# Patient Record
Sex: Male | Born: 1980 | Race: Black or African American | Hispanic: No | Marital: Married | Smoking: Current every day smoker
Health system: Southern US, Community
[De-identification: ages and names within clinical notes are randomized; demographics above are authoritative.]

## PROBLEM LIST (undated history)

## (undated) DIAGNOSIS — E785 Hyperlipidemia, unspecified: Secondary | ICD-10-CM

## (undated) HISTORY — DX: Hyperlipidemia, unspecified: E78.5

---

## 2004-01-29 ENCOUNTER — Emergency Department (HOSPITAL_COMMUNITY): Admission: EM | Admit: 2004-01-29 | Discharge: 2004-01-29 | Payer: Self-pay | Admitting: *Deleted

## 2004-02-13 ENCOUNTER — Ambulatory Visit: Payer: Self-pay | Admitting: Orthopedic Surgery

## 2004-05-03 ENCOUNTER — Emergency Department (HOSPITAL_COMMUNITY): Admission: EM | Admit: 2004-05-03 | Discharge: 2004-05-03 | Payer: Self-pay | Admitting: Emergency Medicine

## 2005-02-08 ENCOUNTER — Emergency Department (HOSPITAL_COMMUNITY): Admission: EM | Admit: 2005-02-08 | Discharge: 2005-02-08 | Payer: Self-pay | Admitting: Emergency Medicine

## 2014-02-23 ENCOUNTER — Encounter (HOSPITAL_COMMUNITY): Payer: Self-pay | Admitting: Cardiology

## 2014-02-23 ENCOUNTER — Emergency Department (HOSPITAL_COMMUNITY)
Admission: EM | Admit: 2014-02-23 | Discharge: 2014-02-23 | Disposition: A | Discharge status: Home / Self Care | Payer: Self-pay | Attending: Emergency Medicine | Admitting: Emergency Medicine

## 2014-02-23 DIAGNOSIS — L989 Disorder of the skin and subcutaneous tissue, unspecified: Secondary | ICD-10-CM | POA: Insufficient documentation

## 2014-02-23 DIAGNOSIS — Z720 Tobacco use: Secondary | ICD-10-CM | POA: Insufficient documentation

## 2014-02-23 LAB — GC/CHLAMYDIA PROBE AMP (~~LOC~~) NOT AT ARMC
CHLAMYDIA, DNA PROBE: NEGATIVE
Neisseria Gonorrhea: POSITIVE — AB

## 2014-02-23 MED ORDER — SULFAMETHOXAZOLE-TRIMETHOPRIM 800-160 MG PO TABS: 1.0000 | ORAL_TABLET | Freq: Two times a day (BID) | ORAL | Status: DC

## 2014-02-23 NOTE — ED Notes (Signed)
Lab at bedside

## 2014-02-23 NOTE — ED Provider Notes (Signed)
CSN: [id number]     Arrival date & time 02/23/14  4098 History   First MD Initiated Contact with Patient 02/23/14 [phone]     Chief Complaint  Patient presents with  . Abscess     (Consider location/radiation/quality/duration/timing/severity/associated sxs/prior Treatment) HPI   Christopher Meza is a 34 y.o. male who presents to the Emergency Department complaining of open lesion to his penis for 3 days.  He states the area began as what he thought was a "pimple" and he squeezed it, with only a slight amt of clear to yellow drainage.  He states it then became an open sore with pain to the touch.  He does report unprotected intercourse, but no new partners.  He was recently incarcerated.  He denies other lesions, fever, fatigue, abdominal pain, penile discharge or urinary changes.     History reviewed. No pertinent past medical history. History reviewed. No pertinent past surgical history. History reviewed. No pertinent family history. History  Substance Use Topics  . Smoking status: Current Every Day Smoker  . Smokeless tobacco: Not on file  . Alcohol Use: Yes     Comment: social    Review of Systems  Constitutional: Negative for fever, chills, activity change and appetite change.  Respiratory: Negative for chest tightness and shortness of breath.   Gastrointestinal: Negative for nausea, vomiting and abdominal pain.  Genitourinary: Negative for dysuria, hematuria, decreased urine volume, penile swelling, scrotal swelling and difficulty urinating.       Open sore to penis  Musculoskeletal: Negative for back pain.  Skin: Negative for rash.  Neurological: Negative for weakness and numbness.  Hematological: Negative for adenopathy.  Psychiatric/Behavioral: Negative for confusion.  All other systems reviewed and are negative.     Allergies  Review of patient's allergies indicates no known allergies.  Home Medications   Prior to Admission medications   Medication Sig Start Date  End Date Taking? Authorizing Provider  ibuprofen (ADVIL,MOTRIN) 200 MG tablet Take 400 mg by mouth every 6 (six) hours as needed for mild pain.   Yes Historical Provider, MD   BP 132/92 mmHg  Pulse 89  Temp(Src) 97.8 F (36.6 C) (Oral)  Resp 18  Ht  (1.778 m)  Wt 170 lb (77.111 kg)  BMI 24.39 kg/m2  SpO2 99% Physical Exam  Constitutional: He is oriented to person, place, and time. He appears well-developed and well-nourished. No distress.  Cardiovascular: Normal rate, regular rhythm, normal heart sounds and intact distal pulses.   No murmur heard. Pulmonary/Chest: Effort normal and breath sounds normal. No respiratory distress.  Abdominal: Soft. He exhibits no distension. There is no tenderness. There is no rebound.  Genitourinary:    Right testis shows no swelling and no tenderness. Left testis shows no swelling and no tenderness. Circumcised. No penile erythema. No discharge found.  Pea sized open lesion to the base of the penis.  No edema or surrounding erythema.  No urethral discharge.  Musculoskeletal: Normal range of motion. He exhibits no edema.  Lymphadenopathy:       Right: No inguinal adenopathy present.       Left: No inguinal adenopathy present.  Neurological: He is alert and oriented to person, place, and time. He exhibits normal muscle tone. Coordination normal.  Skin: Skin is warm and dry. No rash noted.  Psychiatric: He has a normal mood and affect.  Nursing note and vitals reviewed.   ED Course  Procedures (including critical care time) Labs Review Labs Reviewed  HERPES SIMPLEX VIRUS  CULTURE  CULTURE, ROUTINE-ABSCESS  RPR  HIV ANTIBODY (ROUTINE TESTING)  GC/CHLAMYDIA PROBE AMP ([place])    Imaging Review No results found.   EKG Interpretation None       GC, Chlamydia, HIV, RPR, HSV, and abscess cultures pending.  MDM   Final diagnoses:  Skin lesion    Pt with open lesion at the base of the penis.  No urethral discharge, Slight  purulent drainage present.  No surrounding erythema. Lesion appears mostly c/w folliculitis.  I will begin treatment with Bactrim and I have advised him that he will be contacted in 3-5 days with test results.  He agrees to refrain from sex until results are back.  Vitals stable, he is otherwise well appearing, agrees to plan and stable for d/c  [place]ce[place] Walthall L. Trisha Mangleriplett, PA-C 02/23/14 1011  Vanetta MuldersScott Zackowski, MD 02/23/14 1043

## 2014-02-23 NOTE — ED Notes (Signed)
Boil on penis times 3 days.

## 2014-02-24 LAB — RPR: RPR Ser Ql: NONREACTIVE

## 2014-02-26 ENCOUNTER — Telehealth (HOSPITAL_COMMUNITY): Payer: Self-pay

## 2014-02-26 LAB — HIV ANTIBODY (ROUTINE TESTING W REFLEX): HIV Screen 4th Generation wRfx: NONREACTIVE

## 2014-02-26 LAB — CULTURE, ROUTINE-ABSCESS
CULTURE: NO GROWTH
Gram Stain: NONE SEEN

## 2014-02-26 NOTE — ED Notes (Signed)
Positive for gonorrhea. Chart sent to EDP office for review.  

## 2014-02-27 ENCOUNTER — Telehealth (HOSPITAL_BASED_OUTPATIENT_CLINIC_OR_DEPARTMENT_OTHER): Payer: Self-pay | Admitting: Emergency Medicine

## 2014-02-28 ENCOUNTER — Encounter (HOSPITAL_COMMUNITY): Payer: Self-pay | Admitting: *Deleted

## 2014-02-28 ENCOUNTER — Emergency Department (HOSPITAL_COMMUNITY)
Admission: EM | Admit: 2014-02-28 | Discharge: 2014-02-28 | Disposition: A | Discharge status: Home / Self Care | Payer: Self-pay | Attending: Emergency Medicine | Admitting: Emergency Medicine

## 2014-02-28 DIAGNOSIS — Z720 Tobacco use: Secondary | ICD-10-CM | POA: Insufficient documentation

## 2014-02-28 DIAGNOSIS — A549 Gonococcal infection, unspecified: Secondary | ICD-10-CM | POA: Insufficient documentation

## 2014-02-28 DIAGNOSIS — Z792 Long term (current) use of antibiotics: Secondary | ICD-10-CM | POA: Insufficient documentation

## 2014-02-28 MED ORDER — CEFTRIAXONE SODIUM 250 MG IJ SOLR
250.0000 mg | Freq: Once | INTRAMUSCULAR | Status: AC
  Administered 2014-02-28: 250 mg via INTRAMUSCULAR
  Filled 2014-02-28: qty 250

## 2014-02-28 MED ORDER — LIDOCAINE HCL (PF) 1 % IJ SOLN
INTRAMUSCULAR | Status: AC
  Filled 2014-02-28: qty 5

## 2014-02-28 NOTE — Discharge Instructions (Signed)
You have been treated for gonorrhea with the rocephin injection.  Your partners should be advised as they will need treatment as well.  Do not have sex for the next week or until your symptoms are resolved.  You can get followup care at the Health Department or by your primary doctor as needed - see the resource guide above.   Gonorrhea Gonorrhea is an infection that can cause serious problems. If left untreated, the infection may:   Damage the male or male organs.   Cause women to be unable to have children (sterility).   Harm a fetus if the infected woman is pregnant.  It is important to get treatment for gonorrhea as soon as possible. It is also necessary that all your sexual partners be tested for the infection.  CAUSES  Gonorrhea is caused by bacteria called Neisseria gonorrhoeae. The infection is spread from person to person, usually by sexual contact (such as by anal, vaginal, or oral means). A newborn can contract the infection from his or her mother during birth.  SYMPTOMS  Some people with gonorrhea do not have symptoms. Symptoms may be different in females and males.  Females The most common symptoms are:   Pain in the lower abdomen.   Fever with or without chills.  Other symptoms include:   Abnormal vaginal discharge.   Painful intercourse.   Burning or itching of the vagina or lips of the vagina.   Abnormal vaginal bleeding.   Pain when urinating.   Long-lasting (chronic) pain in the lower abdomen, especially during menstruation or intercourse.   Inability to become pregnant.   Going into premature labor.   Irritation, pain, bleeding, or discharge from the rectum. This may occur if the infection was spread by anal sex.   Sore throat or swollen lymph nodes in the neck. This may occur if the infection was spread by oral sex.  Males The most common symptoms are:   Discharge from the penis.   Pain or burning during urination.   Pain or  swelling in the testicles. Other symptoms may include:   Irritation, pain, bleeding, or discharge from the rectum. This may occur if the infection was spread by anal sex.   Sore throat, fever, or swollen lymph nodes in the neck. This may occur if the infection was spread by oral sex.  DIAGNOSIS  A diagnosis is made after a physical exam is done and a sample of discharge is examined under a microscope for the presence of the bacteria. The discharge may be taken from the urethra, cervix, throat, or rectum.  TREATMENT  Gonorrhea is treated with antibiotic medicines. It is important for treatment to begin as soon as possible. Early treatment may prevent some problems from developing.  HOME CARE INSTRUCTIONS   Take medicines only as directed by your health care provider.   Take your antibiotic medicine as directed by your health care provider. Finish the antibiotic even if you start to feel better. Incomplete treatment will put you at risk for continued infection.   Do not have sex until treatment is complete or as directed by your health care provider.   Keep all follow-up visits as directed by your health care provider.   Not all test results are available during your visit. If your test results are not back during the visit, make an appointment with your health care provider to find out the results. Do not assume everything is normal if you have not heard from your health care  provider or the medical facility. It is your responsibility to get your test results.  If you test positive for gonorrhea, inform your recent sexual partners. They need to be checked for gonorrhea even if they do not have symptoms. They may need treatment, even if they test negative for gonorrhea.  SEEK MEDICAL CARE IF:   You develop any bad reaction to the medicine you were prescribed. This may include:   A rash.   Nausea.   Vomiting.   Diarrhea.   Your symptoms do not improve after a few days of  taking antibiotics.   Your symptoms get worse.   You develop increased pain, such as in the testicles (for males) or in the abdomen (for females).  You have a fever. MAKE SURE YOU:   Understand these instructions.  Will watch your condition.  Will get help right away if you are not doing well or get worse. Document Released: 12/27/1999 Document Revised: 05/15/2013 Document Reviewed: 07/06/2012 [facility] Patient Information 2015 [place]. This information is not intended to replace advice given to you by your health care provider. Make sure you discuss any questions you have with your health care provider.   Emergency Department Resource Guide 1) Find a Doctor and Pay Out of Pocket Although you won't have to find out who is covered by your insurance plan, it is a good idea to ask around and get recommendations. You will then need to call the office and see if the doctor you have chosen will accept you as a new patient and what types of options they offer for patients who are self-pay. Some doctors offer discounts or will set up payment plans for their patients who do not have insurance, but you will need to ask so you aren't surprised when you get to your appointment.  2) Contact Your Local Health Department Not all health departments have doctors that can see patients for sick visits, but many do, so it is worth a call to see if yours does. If you don't know where your local health department is, you can check in your phone book. The CDC also has a tool to help you locate your state's health department, and many state websites also have listings of all of their local health departments.  3) Find a Walk-in Clinic If your illness is not likely to be very severe or complicated, you may want to try a walk in clinic. These are popping up all over the country in pharmacies, drugstores, and shopping centers. They're usually staffed by nurse practitioners or physician assistants that have  been trained to treat common illnesses and complaints. They're usually fairly quick and inexpensive. However, if you have serious medical issues or chronic medical problems, these are probably not your best option.  No Primary Care Doctor: - Call Health Connect at  [phone] - they can help you locate a primary care doctor that  accepts your insurance, provides certain services, etc. - Physician Referral Service- [phone]  Chronic Pain Problems: Organization         Address  Phone   Notes  [facility]  [phone]) [phone] Patients need to be referred by their primary care doctor.   Medication Assistance: Organization         Address  Phone   Notes  [facility] Medication [facility] [address] [place], [place] [address] [phone] --Must be a resident of [place] -- Must have NO insurance coverage whatsoever (no  Medicaid/ Medicare, etc.) -- The pt. MUST have a primary care doctor that directs their care regularly and follows them in the community   [facility]ist  [phone]   [employer]  [phone]    Agencies that provide inexpensive medical care: Organization         Address  Phone   Notes  [place]  [phone]   [facility]    [phone]   [facility] [place], [place] [address] [phone]   [place] [address] [place] [phone]   Planned Parenthood    [phone]   [facility]    [phone]   [place] and [place] [address], [place] Phone:  [phone], Fax:  [phone] Hours of Operation:  9 am - 6 pm, M-F.  Also accepts Medicaid/Medicare and self-pay.  [facility] for [place] [address], [address], [place] Phone: [phone], Fax: [phone]. Hours of Operation:  8:30 am - 5:30 pm, M-F.  Also accepts Medicaid and  self-pay.  [facility] [place] [address], [place] Phone: [phone]   [place], [place], [place] [phone], Ext. [phone] Mondays & Thursdays: 7-9 AM.  First 15 patients are seen on a first come, first serve basis.    [place] Providers:  Organization         Address  Phone   Notes  [facility] [address],  [phone] Also accepts self-pay patients.  [facility] [address], [place]  [phone]   [place], [address], [place] [phone]   [facility] [address], [place] [phone]   Lucianne Lei [address], [place]   [phone] Only accepts [place] Access [place] patients after they have their name applied to their card.   Self-Pay (no insurance) in [facility]:  Organization         Address  Phone   Notes  Sickle Cell Patients, [facility] [place] [phone]   [facility] [place] [phone]   [facility] [place]  [place], [place], [place] [phone]   [facility]/Dr. Osei-Bonsu  [address], [place] or [address], [place] [phone] Phone number for both [place] and [place] locations is the same.  [facility] [address], [place] [phone]   [facility] [address], [place] or [address] [phone]   [facility] [address] [place] [phone], phone; [phone], fax Sees patients 1st and 3rd Saturday of every month.  Must not qualify for public or private insurance (i.e. Medicaid, Medicare, [place] Health Choice, Veterans' Benefits)  Household income  should be no more than 200% of the poverty level The clinic cannot treat you if you are pregnant or think you are pregnant  Sexually transmitted diseases are not treated at the clinic.    Dental Care: Organization         Address  Phone  Notes  [place]  [facility] [address] [place], [place] [phone] Accepts children up to age 35 who are enrolled in [place] or [place]; pregnant women with a Medicaid card; and children who have applied for Medicaid or [place] Health Choice, but were declined, whose parents can pay a reduced fee at time of service.  [facility] of [facility]  [address], [place] [phone] Accepts children up to age 67 who are enrolled in [place] or [place]; pregnant women with a Medicaid card; and children who have applied for Medicaid or  Health Choice, but were declined, whose parents can pay a reduced fee at time of service.  [place] [facility] Adult Dental Access PROGRAM  [place] [phone] Patients are seen by appointment only. Walk-ins are not accepted. [place] will see patients 76 years of age and older. Monday - Tuesday (8am-5pm) Most Wednesdays (8:30-5pm) $30 per visit, cash only  [facility] Adult Dental Access PROGRAM  [address], [facility] [phone] Patients are seen by appointment only. Walk-ins are not accepted. [place] will see patients 49 years of age and older. One Wednesday Evening (Monthly: Volunteer Based).  $30 per visit, cash only  [place]  [phone] for adults; Children under age 76, call Graduate Pediatric Dentistry at [phone]. Children aged 54-14, please call [phone] to request a pediatric application.  Dental services are provided in all areas of dental care including fillings, crowns and bridges, complete and partial dentures, implants, gum treatment,  root canals, and extractions. Preventive care is also provided. Treatment is provided to both adults and children. Patients are selected via a lottery and there is often a waiting list.   [facility] [address] [place]  [phone] [email or web address]   [facility] [address] [place], [place] [phone], Ext. [phone] Second and Fourth Thursday of each month, opens at 6:30 AM; Clinic ends at 9 AM.  Patients are seen on a first-come first-served basis, and a limited number are seen during each clinic.   [facility]  [address] Hillard Danker [place], [place] [phone]   Eligibility Requirements You must have lived in [place], [place], or [place] counties for at least the last three months.   You cannot be eligible for state or federal sponsored [employer], including [employer], [place], or [employer].   You generally cannot be eligible for healthcare insurance through your employer.    How to apply: Eligibility screenings are held every Tuesday and Wednesday afternoon from 1:00 pm until 4:00 pm. You do not need an appointment for the interview!  [facility] [address], [place], [place]   [place]  [place] Department  [place]  [phone]    Behavioral Health Resources in the Community: Intensive Outpatient Programs Organization         Address  Phone  Notes  [place]. [address], [place], [place] [phone]   [facility] [address], [place], [place]   ADS: Alcohol & Drug S[facility]cs [address], [place], [place]   [place] [address]  [place], [place] or [phone]   Substance Abuse Resources Organization         Address  Phone  Notes  Alcohol and Drug Services   [phone]   [facility]  [phone]   [facility] [place]  [phone]   Floydene Flock  [phone]   Residential & Outpatient Substance Abuse Program  [phone]   Psychological Services Organization         Address  Phone  Notes  [facility]al Health  [phone]   [facility] Services  [phone]   [facility] [address], [place] [phone] or [phone]    Mobile Crisis Teams Organization         Address  Phone  Notes  [facility], Mobile Crisis Care Unit  [phone]   [facility]  [address] [place], [place] [phone]   Doristine Locks [address] [place] [phone]    Self-Help/Support Groups Organization         Address  Phone             Notes  [facility]ental Health Assoc. of [place] - variety of support groups  336- I7437963 Call for more information  Narcotics Anonymous (NA), [facility] [address], [employer] [place]  2 meetings at this location   Statistician         Address  Phone  Notes  [facility] [address],    [place]  [phone]   [facility]  [address], [place] [address], [place], [place] [phone]   [facility] [address] [place], [place] [phone] Admissions: 8am-3pm M-F  [facility] [address]    [place], [place] [phone]   [facility] [address] [place], [place], [place] [phone]   [facility] [address]  [place], [place] [phone]   [facility] - Intensive Outpatient [address], [place], [place] [phone]   [facility] ([facility] [address] [place].,  [place], [place] [phone] or [phone]   [facility] ([facility]) [address] [address] [place], [place] [phone] Accepts Medicaid  [facility] [place] [address],  [place] [phone] Substance Abuse/Addiction Treatment   [facility] Organization         Address  Phone  Notes  [facility]  [phone]   Angie Fava, PhD [address] Ervin Knack [place], [place]   9194001483 or [phone]   [facility] Behavioral   [address] [place], [place] [phone]   [facility] [address], [place], [place] [phone] Insurance/Medicaid/sponsorship through [facility] and Families [address]                                    [place], [place] [phone] Therapy/tele-psych/case  [facility] [address], [place] [phone]    Dr. Lolly Mustache  [phone]   [facility] [place]  [employer] [facility] Dept. 1) [address] [place] 2) [address], [place] 3)  [address], [place] [phone]  [phone]   [facility] Child Abuse Hotline 307-374-9650 or [phone] (After Hours)

## 2014-02-28 NOTE — ED Notes (Signed)
Pt states he received a phone call stating he needed to come back to the ED or health dept to be treated for gonorrhea.

## 2014-02-28 NOTE — ED Provider Notes (Signed)
CSN: [id number]     Arrival date & time 02/28/14  0757 History   First MD Initiated Contact with Patient 02/28/14 0815     Chief Complaint  Patient presents with  . Follow-up     (Consider location/radiation/quality/duration/timing/severity/associated sxs/prior Treatment) The history is provided by the patient.   Christopher Meza is a 34 y.o. male presenting or treatment of gonorrhea.  He was seen here 5 days ago and treated with bactrim for a nodule on the base of his penis felt to be folliculitis which he states is improved, he continues taking his antibiotic.  His std testing resulted positive for gonorrhea.  He still denies any penile discharge or dysuria.  He has had unprotected sex but not since his last visit 5 days ago.  He denies fevers, chills, abdominal pain or rash.       History reviewed. No pertinent past medical history. History reviewed. No pertinent past surgical history. No family history on file. History  Substance Use Topics  . Smoking status: Current Every Day Smoker  . Smokeless tobacco: Not on file  . Alcohol Use: Yes     Comment: social    Review of Systems  Constitutional: Negative for fever.  HENT: Negative for congestion and sore throat.   Eyes: Negative.   Respiratory: Negative for chest tightness and shortness of breath.   Cardiovascular: Negative for chest pain.  Gastrointestinal: Negative for nausea and abdominal pain.  Genitourinary: Negative.  Negative for dysuria, discharge, penile swelling, scrotal swelling and testicular pain.  Musculoskeletal: Negative for joint swelling, arthralgias and neck pain.  Skin: Negative.  Negative for rash.  Neurological: Negative for dizziness, weakness, light-headedness, numbness and headaches.  Psychiatric/Behavioral: Negative.       Allergies  Review of patient's allergies indicates no known allergies.  Home Medications   Prior to Admission medications   Medication Sig Start Date End Date Taking?  Authorizing Provider  ibuprofen (ADVIL,MOTRIN) 200 MG tablet Take 400 mg by mouth every 6 (six) hours as needed for mild pain.    Historical Provider, MD  sulfamethoxazole-trimethoprim (SEPTRA DS) 800-160 MG per tablet Take 1 tablet by mouth 2 (two) times daily. For 10 days 02/23/14   Tammy L. Triplett, PA-C   BP 144/89 mmHg  Temp(Src) 97.7 F (36.5 C) (Oral)  Resp 16  Ht 5\' 10"  (1.778 m)  Wt 170 lb (77.111 kg)  BMI 24.39 kg/m2  SpO2 100% Physical Exam  Constitutional: He appears well-developed and well-nourished.  HENT:  Head: Normocephalic and atraumatic.  Eyes: Conjunctivae are normal.  Neck: Normal range of motion.  Cardiovascular: Normal rate.   Pulmonary/Chest: Effort normal.  Abdominal: Soft. Bowel sounds are normal. There is no tenderness.  Genitourinary:  Healing lesion dorsal penile base.  No drainage.  Musculoskeletal: Normal range of motion.  Neurological: He is alert.  Skin: Skin is warm and dry.  Psychiatric: He has a normal mood and affect.  Nursing note and vitals reviewed.   ED Course  Procedures (including critical care time) Labs Review Labs Reviewed - No data to display  Imaging Review No results found.   EKG Interpretation None      MDM   Final diagnoses:  Gonorrhea    Pt given rocephin injection.  Advised partners need tx.  Avoid sex for 1 week.  Referrals given for pcp.    Burgess AmorJulie Elianna Windom, PA-C 02/28/14 [phone]  Benny LennertJoseph L Zammit, MD 02/28/14 [phone]

## 2015-10-08 ENCOUNTER — Emergency Department (HOSPITAL_COMMUNITY): Payer: Self-pay

## 2015-10-08 ENCOUNTER — Emergency Department (HOSPITAL_COMMUNITY)
Admission: EM | Admit: 2015-10-08 | Discharge: 2015-10-08 | Disposition: A | Discharge status: Home / Self Care | Payer: Self-pay | Attending: Emergency Medicine | Admitting: Emergency Medicine

## 2015-10-08 ENCOUNTER — Encounter (HOSPITAL_COMMUNITY): Payer: Self-pay | Admitting: Emergency Medicine

## 2015-10-08 DIAGNOSIS — Z79899 Other long term (current) drug therapy: Secondary | ICD-10-CM | POA: Insufficient documentation

## 2015-10-08 DIAGNOSIS — X501XXA Overexertion from prolonged static or awkward postures, initial encounter: Secondary | ICD-10-CM | POA: Insufficient documentation

## 2015-10-08 DIAGNOSIS — Y999 Unspecified external cause status: Secondary | ICD-10-CM | POA: Insufficient documentation

## 2015-10-08 DIAGNOSIS — Z791 Long term (current) use of non-steroidal anti-inflammatories (NSAID): Secondary | ICD-10-CM | POA: Insufficient documentation

## 2015-10-08 DIAGNOSIS — Y929 Unspecified place or not applicable: Secondary | ICD-10-CM | POA: Insufficient documentation

## 2015-10-08 DIAGNOSIS — S8392XA Sprain of unspecified site of left knee, initial encounter: Secondary | ICD-10-CM | POA: Insufficient documentation

## 2015-10-08 DIAGNOSIS — F17210 Nicotine dependence, cigarettes, uncomplicated: Secondary | ICD-10-CM | POA: Insufficient documentation

## 2015-10-08 DIAGNOSIS — Y9301 Activity, walking, marching and hiking: Secondary | ICD-10-CM | POA: Insufficient documentation

## 2015-10-08 MED ORDER — NAPROXEN 500 MG PO TABS: 500.0000 mg | ORAL_TABLET | Freq: Two times a day (BID) | ORAL | 0 refills | Status: DC

## 2015-10-08 NOTE — ED Triage Notes (Signed)
Pt reports left knee pain x1 month, denies injury. States he just "woke up" one day and had pain. Pt ambulatory to room.

## 2015-10-08 NOTE — ED Provider Notes (Signed)
[facility]-EMERGENCY DEPT Provider Note   CSN: [id number] Arrival date & time: 10/08/15  4098     History   Chief Complaint Chief Complaint  Patient presents with  . Knee Pain    HPI Christopher Meza is a 35 y.o. male.  HPI  Christopher Meza is a 35 y.o. male who presents to the Emergency Department complaining of left knee pain for one month.  He describes an aching and tightness to the knee with bending and standing.  He states the pain has been mild, but worsening for one week.  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  The knee pain is associated with swelling.  He denies recent injury, calf pain or swelling, fever, or redness of the joint.     History reviewed. No pertinent past medical history.  There are no active problems to display for this patient.   History reviewed. No pertinent surgical history.     Home Medications    Prior to Admission medications   Medication Sig Start Date End Date Taking? Authorizing Provider  ibuprofen (ADVIL,MOTRIN) 200 MG tablet Take 400 mg by mouth every 6 (six) hours as needed for mild pain.    Historical Provider, MD  sulfamethoxazole-trimethoprim (SEPTRA DS) 800-160 MG per tablet Take 1 tablet by mouth 2 (two) times daily. For 10 days 02/23/14   Pauline Aus, PA-C    Family History History reviewed. No pertinent family history.  Social History Social History  Substance Use Topics  . Smoking status: Current Every Day Smoker    Packs/day: 0.50    Types: Cigarettes  . Smokeless tobacco: Never Used  . Alcohol use Yes     Comment: social     Allergies   Review of patient's allergies indicates no known allergies.   Review of Systems Review of Systems  Constitutional: Negative for chills and fever.  Genitourinary: Negative for difficulty urinating and dysuria.  Musculoskeletal: Positive for arthralgias (left knee pain) and joint swelling.  Skin: Negative for color change and  wound.  Neurological: Negative for weakness and numbness.  All other systems reviewed and are negative.    Physical Exam Updated Vital Signs BP 130/82 (BP Location: Left Arm)   Pulse 95   Temp 97.4 F (36.3 C) (Oral)   Resp 16   Ht 5\' 10"  (1.778 m)   Wt 79.4 kg   SpO2 98%   BMI 25.11 kg/m   Physical Exam  Constitutional: He is oriented to person, place, and time. He appears well-developed and well-nourished. No distress.  Cardiovascular: Normal rate, regular rhythm and intact distal pulses.   Pulmonary/Chest: Effort normal and breath sounds normal.  Musculoskeletal: He exhibits tenderness. He exhibits no edema.  ttp of the anterior left knee.  No erythema, effusion, excessive warmth or step-off deformity.  DP pulse brisk, distal sensation intact. Calf is soft and NT.  Neurological: He is alert and oriented to person, place, and time. He exhibits normal muscle tone. Coordination normal.  Skin: Skin is warm and dry. No erythema.  Nursing note and vitals reviewed.    ED Treatments / Results  Labs (all labs ordered are listed, but only abnormal results are displayed) Labs Reviewed - No data to display  EKG  EKG Interpretation None       Radiology Dg Knee Complete 4 Views Left  Result Date: 10/08/2015 CLINICAL DATA:  Pain for 1 month EXAM: LEFT KNEE - COMPLETE 4+ VIEW COMPARISON:  None. FINDINGS: Weightbearing frontal, weight-bearing tunnel, lateral, and sunrise patellar images were obtained. There is soft tissue swelling medially. There is no fracture or dislocation. No joint effusion. The joint spaces appear normal. No erosive change. IMPRESSION: Soft tissue swelling medially. No fracture or joint effusion. No evident arthropathy. Electronically Signed   By: Bretta BangWilliam  Woodruff III M.D.   On: 10/08/2015 09:35    Procedures Procedures (including critical care time)  Medications Ordered in ED Medications - No data to display   Initial Impression / Assessment and Plan  / ED Course  I have reviewed the triage vital signs and the nursing notes.  Pertinent labs & imaging results that were available during my care of the patient were reviewed by me and considered in my medical decision making (see chart for details).  Clinical Course    Pt with diffuse tenderness of the left knee that's chronic.  No concerning sx for septic joint.  Ambulates with steady gait.   ACE wrap applied.  Agrees to RICE therapy and NSAID.  Referral to orthopedics.   Final Clinical Impressions(s) / ED Diagnoses   Final diagnoses:  Left knee sprain, initial encounter    New Prescriptions New Prescriptions   No medications on file     Pauline Ausammy Ashantae Pangallo, PA-C 10/08/15 1021    Blane OharaJoshua Zavitz, MD 10/08/15 1540

## 2015-10-08 NOTE — Discharge Instructions (Signed)
Wear the ace wrap as needed, but not continuously.  Apply ice packs on/off.  Do not wear to bed.  Call Dr. Mort SawyersHarrison's office to arrange a follow-up appt.

## 2015-12-11 ENCOUNTER — Ambulatory Visit: Payer: Self-pay | Admitting: Physician Assistant

## 2015-12-11 ENCOUNTER — Encounter: Payer: Self-pay | Admitting: Physician Assistant

## 2015-12-11 VITALS — BP 110/70 | HR 92 | Temp 97.9°F | Ht 69.5 in | Wt 168.5 lb

## 2015-12-11 DIAGNOSIS — F17210 Nicotine dependence, cigarettes, uncomplicated: Secondary | ICD-10-CM | POA: Insufficient documentation

## 2015-12-11 DIAGNOSIS — M25562 Pain in left knee: Principal | ICD-10-CM

## 2015-12-11 DIAGNOSIS — K0889 Other specified disorders of teeth and supporting structures: Secondary | ICD-10-CM | POA: Insufficient documentation

## 2015-12-11 DIAGNOSIS — Z8639 Personal history of other endocrine, nutritional and metabolic disease: Secondary | ICD-10-CM

## 2015-12-11 DIAGNOSIS — G8929 Other chronic pain: Secondary | ICD-10-CM | POA: Insufficient documentation

## 2015-12-11 DIAGNOSIS — Z131 Encounter for screening for diabetes mellitus: Secondary | ICD-10-CM

## 2015-12-11 LAB — GLUCOSE, POCT (MANUAL RESULT ENTRY): POC GLUCOSE: 88 mg/dL (ref 70–99)

## 2015-12-11 MED ORDER — DICLOFENAC SODIUM 75 MG PO TBEC: 75.0000 mg | DELAYED_RELEASE_TABLET | Freq: Two times a day (BID) | ORAL | 0 refills | Status: DC

## 2015-12-11 NOTE — Patient Instructions (Signed)

## 2015-12-11 NOTE — Progress Notes (Signed)
BP 110/70 (BP Location: Left Arm, Patient Position: Sitting, Cuff Size: Normal)   Pulse 92   Temp 97.9 F (36.6 C) (Other (Comment))   Ht 5' 9.5" (1.765 m)   Wt 168 lb 8 oz (76.4 kg)   SpO2 96%   BMI 24.53 kg/m    Subjective:    Patient ID: Christopher Meza, male    DOB: 08/13/1980, [age] y.o.   MRN: [id number]  HPI: Christopher Meza is a 35 y.o. male presenting on 12/11/2015 for New Patient (Initial Visit)   HPI   Pt's Main concern is L knee pain.   Hurting for 2-3 months.  Unaware of injury.  Says "it just started hurting".  It's still a little bit swollen he says.  He went to ER for this in September and had normal xray at that time   He also c/o tooth pain at times. Has a hole in his tooth. Tooth is also cracked.  Hurting for about a month. Hole in there for about  2 months.  Pt says he is Not working now  [facility] seen there in October. Says he goes there for STDs  Relevant past medical, surgical, family and social history reviewed and updated as indicated. Interim medical history since our last visit reviewed. Allergies and medications reviewed and updated.   Current Outpatient Prescriptions:  .  guaifenesin (ROBITUSSIN) 100 MG/5ML syrup, Take 200 mg by mouth 2 (two) times daily as needed for cough., Disp: , Rfl:    Review of Systems  Constitutional: Positive for appetite change and diaphoresis. Negative for chills, fatigue, fever and unexpected weight change.  HENT: Positive for congestion and dental problem. Negative for drooling, ear pain, facial swelling, hearing loss, mouth sores, sneezing, sore throat, trouble swallowing and voice change.   Eyes: Negative for pain, discharge, redness, itching and visual disturbance.  Respiratory: Positive for cough, shortness of breath and wheezing. Negative for choking.   Cardiovascular: Negative for chest pain, palpitations and leg swelling.  Gastrointestinal: Negative for abdominal pain, blood in stool,  constipation, diarrhea and vomiting.  Endocrine: Positive for polydipsia. Negative for cold intolerance and heat intolerance.  Genitourinary: Negative for decreased urine volume, dysuria and hematuria.  Musculoskeletal: Positive for arthralgias. Negative for back pain and gait problem.  Skin: Negative for rash.  Allergic/Immunologic: Negative for environmental allergies.  Neurological: Negative for seizures, syncope, light-headedness and headaches.  Hematological: Negative for adenopathy.  Psychiatric/Behavioral: Positive for agitation. Negative for dysphoric mood and suicidal ideas. The patient is not nervous/anxious.     Per HPI unless specifically indicated above     Objective:    BP 110/70 (BP Location: Left Arm, Patient Position: Sitting, Cuff Size: Normal)   Pulse 92   Temp 97.9 F (36.6 C) (Other (Comment))   Ht 5' 9.5" (1.765 m)   Wt 168 lb 8 oz (76.4 kg)   SpO2 96%   BMI 24.53 kg/m   Wt Readings from Last 3 Encounters:  12/11/15 168 lb 8 oz (76.4 kg)  10/08/15 175 lb (79.4 kg)  02/28/14 170 lb (77.1 kg)    Physical Exam  Constitutional: He is oriented to person, place, and time. He appears well-developed and well-nourished.  HENT:  Head: Normocephalic and atraumatic.  Mouth/Throat: Uvula is midline and oropharynx is clear and moist. Abnormal dentition. No dental abscesses. No oropharyngeal exudate.  Small hole in L upper molar tooth.  No abscess  Eyes: Conjunctivae and EOM are normal. Pupils are  equal, round, and reactive to light.  Neck: Neck supple. No thyromegaly present.  Cardiovascular: Normal rate and regular rhythm.   Pulmonary/Chest: Effort normal and breath sounds normal. He has no wheezes. He has no rales.  Abdominal: Soft. Bowel sounds are normal. He exhibits no mass. There is no hepatosplenomegaly. There is no tenderness.  Musculoskeletal: He exhibits no edema.       Left knee: He exhibits swelling (very mild). He exhibits normal range of motion, no  effusion, no ecchymosis, no deformity, no erythema, normal alignment, no LCL laxity, no bony tenderness and no MCL laxity.  Mild tenderness medially L knee  Lymphadenopathy:    He has no cervical adenopathy.  Neurological: He is alert and oriented to person, place, and time.  Skin: Skin is warm and dry. No rash noted.  Psychiatric: He has a normal mood and affect. His behavior is normal. Thought content normal.  Vitals reviewed.      Assessment & Plan:     Encounter Diagnoses  Name Primary?  . Chronic pain of left knee Yes  . Toothache   . Cigarette nicotine dependence without complication   . History of hyperlipidemia   . Screening for diabetes mellitus      -get baseline labs -pt put on Dental list -rx Diclofenac, ice the knee 2 or 3 times daily and wear elastic knee sleeve while up and ambulating -counseled pt to use condoms to reduce risks of STDs -follow up in one month to review labs and reevaluate knee Pt wants to be seen here

## 2015-12-12 LAB — COMPREHENSIVE METABOLIC PANEL
ALT: 14 U/L (ref 9–46)
AST: 14 U/L (ref 10–40)
Albumin: 4 g/dL (ref 3.6–5.1)
Alkaline Phosphatase: 57 U/L (ref 40–115)
BILIRUBIN TOTAL: 0.4 mg/dL (ref 0.2–1.2)
BUN: 14 mg/dL (ref 7–25)
CALCIUM: 9.5 mg/dL (ref 8.6–10.3)
CO2: 27 mmol/L (ref 20–31)
Chloride: 106 mmol/L (ref 98–110)
Creat: 0.88 mg/dL (ref 0.60–1.35)
GLUCOSE: 96 mg/dL (ref 65–99)
Potassium: 4.6 mmol/L (ref 3.5–5.3)
Sodium: 140 mmol/L (ref 135–146)
TOTAL PROTEIN: 6.8 g/dL (ref 6.1–8.1)

## 2015-12-12 LAB — LIPID PANEL
CHOLESTEROL: 199 mg/dL (ref <200)
HDL: 40 mg/dL — ABNORMAL LOW (ref >40)
LDL CALC: 145 mg/dL — AB (ref <100)
Total CHOL/HDL Ratio: 5 Ratio — ABNORMAL HIGH (ref <5.0)
Triglycerides: 71 mg/dL (ref <150)
VLDL: 14 mg/dL (ref <30)

## 2015-12-13 LAB — HEMOGLOBIN A1C
Hgb A1c MFr Bld: 4.5 % (ref <5.7)
MEAN PLASMA GLUCOSE: 82 mg/dL

## 2016-01-16 ENCOUNTER — Ambulatory Visit: Payer: Self-pay | Admitting: Physician Assistant

## 2016-01-20 ENCOUNTER — Encounter: Payer: Self-pay | Admitting: Physician Assistant

## 2016-02-06 ENCOUNTER — Ambulatory Visit: Payer: Self-pay | Admitting: Physician Assistant

## 2016-02-13 ENCOUNTER — Encounter: Payer: Self-pay | Admitting: Physician Assistant

## 2016-03-31 ENCOUNTER — Ambulatory Visit: Payer: Self-pay | Admitting: Physician Assistant

## 2016-04-01 ENCOUNTER — Encounter: Payer: Self-pay | Admitting: Student

## 2017-01-20 ENCOUNTER — Encounter (HOSPITAL_COMMUNITY): Payer: Self-pay | Admitting: Emergency Medicine

## 2017-01-20 DIAGNOSIS — E785 Hyperlipidemia, unspecified: Secondary | ICD-10-CM | POA: Insufficient documentation

## 2017-01-20 DIAGNOSIS — F17210 Nicotine dependence, cigarettes, uncomplicated: Secondary | ICD-10-CM | POA: Insufficient documentation

## 2017-01-20 DIAGNOSIS — K029 Dental caries, unspecified: Secondary | ICD-10-CM | POA: Insufficient documentation

## 2017-01-20 MED ORDER — IBUPROFEN 400 MG PO TABS
400.0000 mg | ORAL_TABLET | Freq: Once | ORAL | Status: AC | PRN
  Administered 2017-01-20: 400 mg via ORAL
  Filled 2017-01-20: qty 1

## 2017-01-20 NOTE — ED Triage Notes (Signed)
Pt presents with L upper dental pain ongoing for several weeks and putting off getting treatment from dentist and can no longer take the pain

## 2017-01-21 ENCOUNTER — Emergency Department (HOSPITAL_COMMUNITY)
Admission: EM | Admit: 2017-01-21 | Discharge: 2017-01-21 | Disposition: A | Discharge status: Home / Self Care | Payer: Self-pay | Attending: Emergency Medicine | Admitting: Emergency Medicine

## 2017-01-21 DIAGNOSIS — K029 Dental caries, unspecified: Secondary | ICD-10-CM

## 2017-01-21 MED ORDER — PENICILLIN V POTASSIUM 500 MG PO TABS: 500.0000 mg | ORAL_TABLET | Freq: Four times a day (QID) | ORAL | 0 refills | Status: AC

## 2017-01-21 MED ORDER — IBUPROFEN 800 MG PO TABS
ORAL_TABLET | ORAL | Status: AC
  Administered 2017-01-21: 03:00:00
  Filled 2017-01-21: qty 1

## 2017-01-21 MED ORDER — PENICILLIN V POTASSIUM 250 MG PO TABS
ORAL_TABLET | ORAL | Status: AC
  Administered 2017-01-21: 03:00:00
  Filled 2017-01-21: qty 2

## 2017-01-21 MED ORDER — NAPROXEN 375 MG PO TABS: 375.0000 mg | ORAL_TABLET | Freq: Two times a day (BID) | ORAL | 0 refills | Status: DC

## 2017-01-21 NOTE — ED Provider Notes (Signed)
[facility] EMERGENCY DEPARTMENT Provider Note   CSN: [id number] Arrival date & time: 01/20/17  2148     History   Chief Complaint Chief Complaint  Patient presents with  . Dental Pain  . Oral Swelling    HPI Christopher Meza is a 37 y.o. male.  The history is provided by the patient.  Dental Pain   This is a recurrent problem. The current episode started more than 1 week ago. The problem occurs constantly. The problem has not changed since onset.The pain is at a severity of 10/10. The pain is severe. He has tried nothing for the symptoms. The treatment provided no relief.  Known caries that haven't been Xrayed or fixed yet  Past Medical History:  Diagnosis Date  . Hyperlipidemia     Patient Active Problem List   Diagnosis Date Noted  . Cigarette nicotine dependence without complication 12/11/2015  . Chronic pain of left knee 12/11/2015  . Toothache 12/11/2015    History reviewed. No pertinent surgical history.     Home Medications    Prior to Admission medications   Medication Sig Start Date End Date Taking? Authorizing Provider  diclofenac (VOLTAREN) 75 MG EC tablet Take 1 tablet (75 mg total) by mouth 2 (two) times daily. 12/11/15   Jacquelin Hawking, PA-C  guaifenesin (ROBITUSSIN) 100 MG/5ML syrup Take 200 mg by mouth 2 (two) times daily as needed for cough.    [provider]  naproxen (NAPROSYN) 375 MG tablet Take 1 tablet (375 mg total) by mouth 2 (two) times daily. 01/21/17   Dua Mehler, MD  penicillin v potassium (VEETID) 500 MG tablet Take 1 tablet (500 mg total) by mouth 4 (four) times daily for 7 days. 01/21/17 01/28/17  Raya Mckinstry, MD    Family History Family History  Problem Relation Age of Onset  . Diabetes Mother   . Diabetes Maternal Grandmother     Social History Social History   Tobacco Use  . Smoking status: Current Every Day Smoker    Packs/day: 1.00    Years: 20.00    Pack years: 20.00    Types:  Cigarettes  . Smokeless tobacco: Never Used  Substance Use Topics  . Alcohol use: Yes    Comment: social  . Drug use: Yes    Types: Marijuana     Allergies   Patient has no known allergies.   Review of Systems Review of Systems  Constitutional: Negative for fever and unexpected weight change.  HENT: Positive for dental problem. Negative for congestion, ear pain and facial swelling.   Respiratory: Negative for shortness of breath.   Cardiovascular: Negative for chest pain.  Genitourinary: Negative for hematuria.  All other systems reviewed and are negative.    Physical Exam Updated Vital Signs BP 121/75 (BP Location: Right Arm)   Pulse 84   Temp 98.3 F (36.8 C) (Oral)   Resp 16   Ht 5\' 10"  (1.778 m)   Wt 79.4 kg (175 lb)   SpO2 99%   BMI 25.11 kg/m   Physical Exam  Constitutional: He is oriented to person, place, and time. He appears well-developed. No distress.  HENT:  Head: Normocephalic and atraumatic.  Nose: Nose normal.  Mouth/Throat: No trismus in the jaw. Dental caries present. No dental abscesses or uvula swelling. No oropharyngeal exudate.  Eyes: Conjunctivae are normal. Pupils are equal, round, and reactive to light.  Neck: Normal range of motion. Neck supple.  Cardiovascular: Normal rate, regular rhythm, normal heart  sounds and intact distal pulses.  Pulmonary/Chest: Effort normal and breath sounds normal. No stridor. He has no wheezes. He has no rales.  Abdominal: Soft. Bowel sounds are normal. He exhibits no mass. There is no tenderness. There is no rebound and no guarding.  Musculoskeletal: Normal range of motion.  Lymphadenopathy:    He has no cervical adenopathy.  Neurological: He is alert and oriented to person, place, and time. He displays normal reflexes.  Skin: Skin is warm and dry. Capillary refill takes less than 2 seconds.     ED Treatments / Results   Vitals:   01/20/17 2308  BP: 121/75  Pulse: 84  Resp: 16  Temp: 98.3 F (36.8  C)  SpO2: 99%   Procedures Procedures (including critical care time)  Medications Ordered in ED Medications  ibuprofen (ADVIL,MOTRIN) 800 MG tablet (not administered)  penicillin v potassium (VEETID) 250 MG tablet (not administered)  ibuprofen (ADVIL,MOTRIN) tablet 400 mg (400 mg Oral Given 01/20/17 2319)       Final Clinical Impressions(s) / ED Diagnoses   Final diagnoses:  Pain due to dental caries  Return for worsening pain, fevers > 100.4 unrelieved by medication, shortness of breath, intractable vomiting, or diarrhea, abdominal pain, Inability to tolerate liquids or food, cough, altered mental status or any concerns. No signs of systemic illness or infection. The patient is nontoxic-appearing on exam and vital signs are within normal limits.    I have reviewed the triage vital signs and the nursing notes. Pertinent labs &imaging results that were available during my care of the patient were reviewed by me and considered in my medical decision making (see chart for details).  After history, exam, and medical workup I feel the patient has been appropriately medically screened and is safe for discharge home. Pertinent diagnoses were discussed with the patient. Patient was given return precautions.      ED Discharge Orders        Ordered    penicillin v potassium (VEETID) 500 MG tablet  4 times daily     01/21/17 0209    naproxen (NAPROSYN) 375 MG tablet  2 times daily     01/21/17 0209       Vicki Chaffin, MD 01/21/17 0211

## 2017-10-21 ENCOUNTER — Encounter (HOSPITAL_COMMUNITY): Payer: Self-pay | Admitting: Emergency Medicine

## 2017-10-21 ENCOUNTER — Emergency Department (HOSPITAL_COMMUNITY)
Admission: EM | Admit: 2017-10-21 | Discharge: 2017-10-21 | Disposition: A | Discharge status: Home / Self Care | Payer: Self-pay | Attending: Emergency Medicine | Admitting: Emergency Medicine

## 2017-10-21 ENCOUNTER — Telehealth: Payer: Self-pay | Admitting: *Deleted

## 2017-10-21 DIAGNOSIS — Z79899 Other long term (current) drug therapy: Secondary | ICD-10-CM | POA: Insufficient documentation

## 2017-10-21 DIAGNOSIS — K047 Periapical abscess without sinus: Secondary | ICD-10-CM | POA: Insufficient documentation

## 2017-10-21 DIAGNOSIS — K0889 Other specified disorders of teeth and supporting structures: Secondary | ICD-10-CM

## 2017-10-21 DIAGNOSIS — F17210 Nicotine dependence, cigarettes, uncomplicated: Secondary | ICD-10-CM | POA: Insufficient documentation

## 2017-10-21 MED ORDER — NAPROXEN 500 MG PO TABS: 500.0000 mg | ORAL_TABLET | Freq: Two times a day (BID) | ORAL | 0 refills | Status: DC

## 2017-10-21 MED ORDER — NAPROXEN 250 MG PO TABS
500.0000 mg | ORAL_TABLET | Freq: Once | ORAL | Status: AC
  Administered 2017-10-21: 500 mg via ORAL
  Filled 2017-10-21: qty 2

## 2017-10-21 MED ORDER — AMOXICILLIN-POT CLAVULANATE 875-125 MG PO TABS: 1.0000 | ORAL_TABLET | Freq: Two times a day (BID) | ORAL | 0 refills | Status: DC

## 2017-10-21 NOTE — ED Triage Notes (Signed)
Pt reports being here for dental abscess few months ago where he was given antibiotics. States he took ibuprofen yesterday for pain, but this am it began to swell. He has not seen dentist yet due to loss of insurance.

## 2017-10-21 NOTE — ED Provider Notes (Signed)
[facility] EMERGENCY DEPARTMENT Provider Note   CSN: [id number] Arrival date & time: 10/21/17  1191     History   Chief Complaint Chief Complaint  Patient presents with  . Dental Pain  . Abscess    HPI Christopher Meza is a 37 y.o. male with a hx of tobacco abuse and hyperlipidemia who presents to the ED with complaints of dental pain x 1 day.  Patient states that he has been having seen/waning dental issues to the left upper jaw for several months,  he states he has required antibiotics in the past.  He states that when he has received antibiotics it has improved- most recently on chart review appears to have been in January of this year. He states that this morning he woke up with increased pain to the left upper jaw as well as left-sided facial swelling.  His current discomfort is severe in nature, without specific alleviating or aggravating factors.  No intervention tried prior to arrival. Denies difficulty breathing/swallowing, drooling, change in voice, neck pain/stiffness, fevers, intra-oral drainage, or swelling beneath the tongue.   HPI  Past Medical History:  Diagnosis Date  . Hyperlipidemia     Patient Active Problem List   Diagnosis Date Noted  . Cigarette nicotine dependence without complication 12/11/2015  . Chronic pain of left knee 12/11/2015  . Toothache 12/11/2015    History reviewed. No pertinent surgical history.      Home Medications    Prior to Admission medications   Medication Sig Start Date End Date Taking? Authorizing Provider  diclofenac (VOLTAREN) 75 MG EC tablet Take 1 tablet (75 mg total) by mouth 2 (two) times daily. 12/11/15   Jacquelin Hawking, PA-C  guaifenesin (ROBITUSSIN) 100 MG/5ML syrup Take 200 mg by mouth 2 (two) times daily as needed for cough.    [provider]  naproxen (NAPROSYN) 375 MG tablet Take 1 tablet (375 mg total) by mouth 2 (two) times daily. 01/21/17   Palumbo, April, MD    Family  History Family History  Problem Relation Age of Onset  . Diabetes Mother   . Diabetes Maternal Grandmother     Social History Social History   Tobacco Use  . Smoking status: Current Every Day Smoker    Packs/day: 1.00    Years: 20.00    Pack years: 20.00    Types: Cigarettes  . Smokeless tobacco: Never Used  Substance Use Topics  . Alcohol use: Yes    Comment: social  . Drug use: Yes    Types: Marijuana     Allergies   Patient has no known allergies.   Review of Systems Review of Systems  HENT: Positive for dental problem and facial swelling. Negative for sore throat, trouble swallowing and voice change.   Respiratory: Negative for shortness of breath.   Gastrointestinal: Negative for nausea and vomiting.  Musculoskeletal: Negative for neck pain and neck stiffness.  Skin: Negative for rash and wound.     Physical Exam Updated Vital Signs BP 135/83 (BP Location: Right Arm)   Pulse 86   Temp 98.7 F (37.1 C) (Oral)   Resp 16   SpO2 100%   Physical Exam  Constitutional: He appears well-developed and well-nourished.  Non-toxic appearance. No distress.  HENT:  Head: Normocephalic and atraumatic.  Right Ear: Tympanic membrane is not perforated, not erythematous, not retracted and not bulging.  Left Ear: Tympanic membrane is not perforated, not erythematous, not retracted and not bulging.  Nose: Nose normal.  Mouth/Throat: Uvula is midline and oropharynx is clear and moist. No oropharyngeal exudate or posterior oropharyngeal erythema.  Patient has poor dentition throughout, more prominent to the left upper jaw with several dental caries and cracked teeth. The left upper gingiva is erythematous, swollen, and tender to palpation. There is no palpable fluctuance or appreciable abscess intra-orally or upon palpation of external skin. There is overlying left sided facial swelling to the left maxillary area without overlying erythema/warmth or palpable  fluctuance/induration.   Patient tolerating own secretions without difficulty. No trismus. No drooling. No hot potato voice. No swelling beneath the tongue, submandibular compartment is soft.    Eyes: Conjunctivae are normal. Right eye exhibits no discharge. Left eye exhibits no discharge.  Neck: Normal range of motion and full passive range of motion without pain. Neck supple. No neck rigidity. No edema and no erythema present.  Cardiovascular: Normal rate and regular rhythm.  Pulmonary/Chest: Effort normal and breath sounds normal.  Lymphadenopathy:    He has no cervical adenopathy.  Neurological: He is alert.  Psychiatric: He has a normal mood and affect. His behavior is normal. Thought content normal.  Nursing note and vitals reviewed.    ED Treatments / Results  Labs (all labs ordered are listed, but only abnormal results are displayed) Labs Reviewed - No data to display  EKG None  Radiology No results found.  Procedures Procedures (including critical care time)  Medications Ordered in ED Medications - No data to display   Initial Impression / Assessment and Plan / ED Course  I have reviewed the triage vital signs and the nursing notes.  Pertinent labs & imaging results that were available during my care of the patient were reviewed by me and considered in my medical decision making (see chart for details).    Patient presents with dental pain. Patient is nontoxic appearing, vitals without significant abnormality. No gross abscess.  Exam unconcerning for Ludwig's angina.  Will treat with Augmentin and Naproxen.  Urged patient to follow-up with dentist, dental resources were provided.  Discussed treatment plan and need for follow up as well as strict return precautions with patient at length. Provided opportunity for questions, patient confirmed understanding and is agreeable to plan.   Final Clinical Impressions(s) / ED Diagnoses   Final diagnoses:  Pain, dental   Dental infection    ED Discharge Orders         Ordered    amoxicillin-clavulanate (AUGMENTIN) 875-125 MG tablet  Every 12 hours     10/21/17 1000    naproxen (NAPROSYN) 500 MG tablet  2 times daily     10/21/17 1000           Cherly Anderson, PA-C 10/21/17 1001    Arby Barrette, MD 10/21/17 1502

## 2017-10-21 NOTE — Telephone Encounter (Signed)
EDCM received call from pharmacy regarding pt not having printed Rx with him.  EDCM gave verbal as written.

## 2017-10-21 NOTE — ED Notes (Signed)
Declined W/C at D/C and was escorted to lobby by RN. 

## 2017-10-21 NOTE — Discharge Instructions (Signed)
Call one of the dentists offices provided to schedule an appointment for re-evaluation and further management within the next 48 hours.  ° °I have prescribed you Augmentin which is an antibiotic to treat the infection and Naproxen which is an anti-inflammatory medicine to treat the pain.  ° °Please take all of your antibiotics until finished. You may develop abdominal discomfort or diarrhea from the antibiotic.  You may help offset this with probiotics which you can buy at the store (ask your pharmacist if unable to find) or get probiotics in the form of eating yogurt. Do not eat or take the probiotics until 2 hours after your antibiotic. If you are unable to tolerate these side effects follow-up with your primary care provider or return to the emergency department.  ° °If you begin to experience any blistering, rashes, swelling, or difficulty breathing seek medical care for evaluation of potentially more serious side effects.  ° °Be sure to eat something when taking the Naproxen as it can cause stomach upset and at worst stomach bleeding. Do not take additional non steroidal anti-inflammatory medicines such as Ibuprofen, Aleve, Advil, Mobic, Diclofenac, or goodie powder while taking Naproxen. You may supplement with Tylenol.  ° °We have prescribed you new medication(s) today. Discuss the medications prescribed today with your pharmacist as they can have adverse effects and interactions with your other medicines including over the counter and prescribed medications. Seek medical evaluation if you start to experience new or abnormal symptoms after taking one of these medicines, seek care immediately if you start to experience difficulty breathing, feeling of your throat closing, facial swelling, or rash as these could be indications of a more serious allergic reaction ° °If you start to experience and new or worsening symptoms return to the emergency department. If you start to experience fever, chills, neck  stiffness/pain, or inability to move your neck or open your mouth come back to the emergency department immediately.  ° °

## 2018-01-04 IMAGING — DX DG KNEE COMPLETE 4+V*L*
4 series · 4 of 4 positions shown · non-contrast
Comparison: None.

CLINICAL DATA: Pain for 1 month

EXAM:
LEFT KNEE - COMPLETE 4+ VIEW

[knee ap]
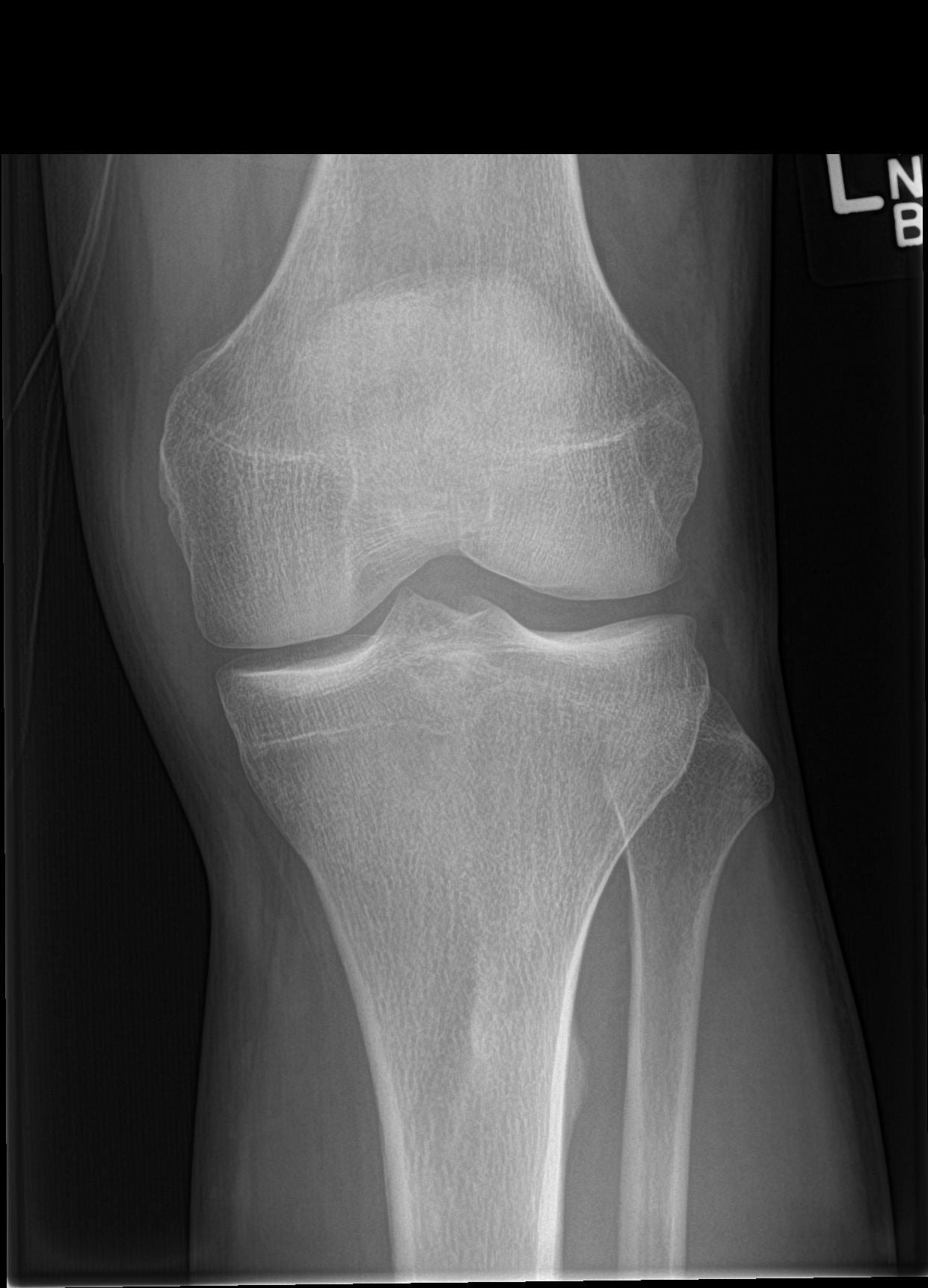

[tunnel]
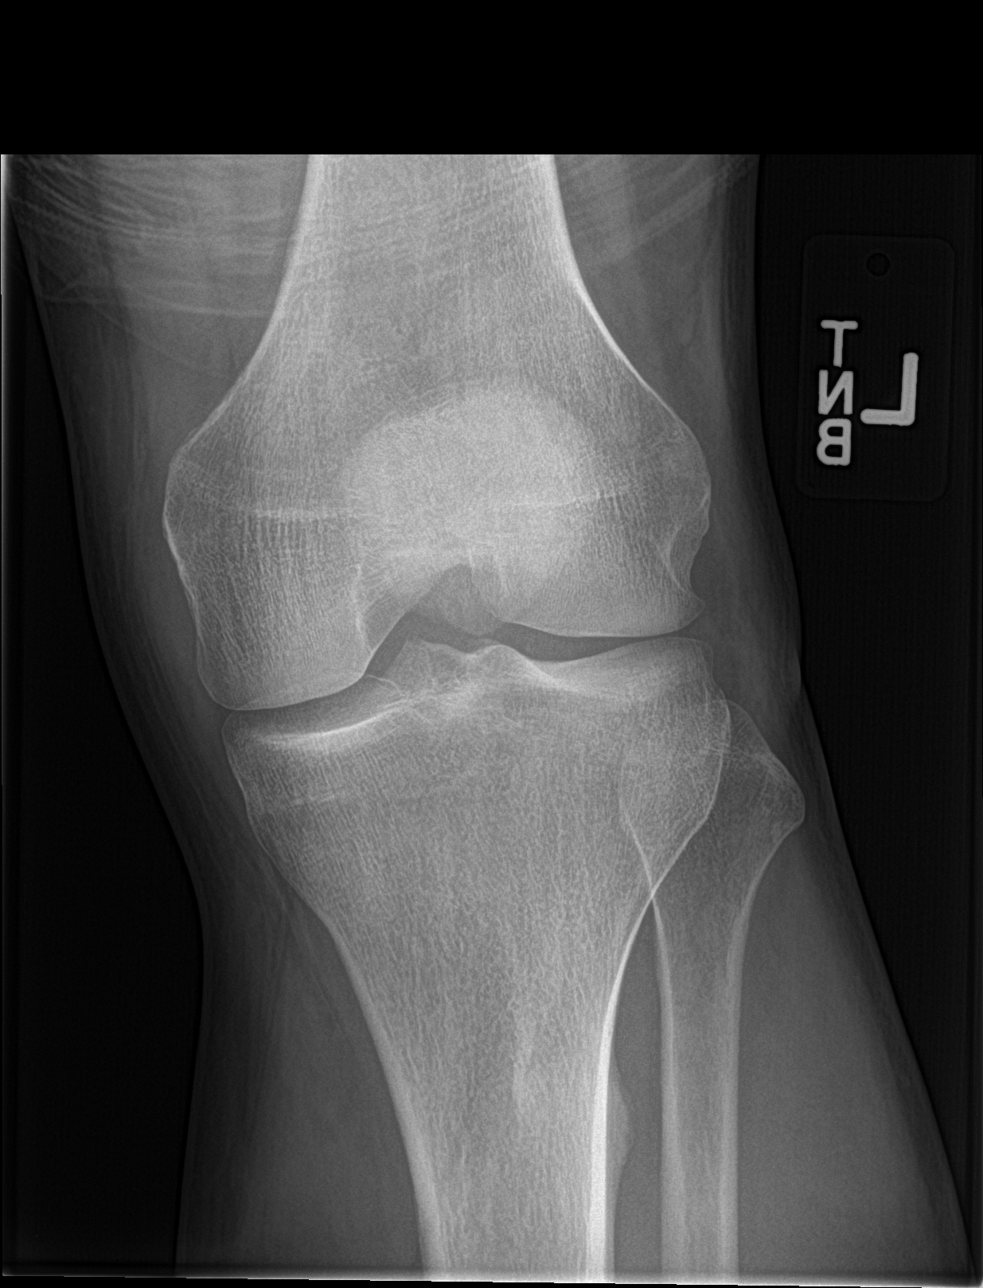

[knee lat]
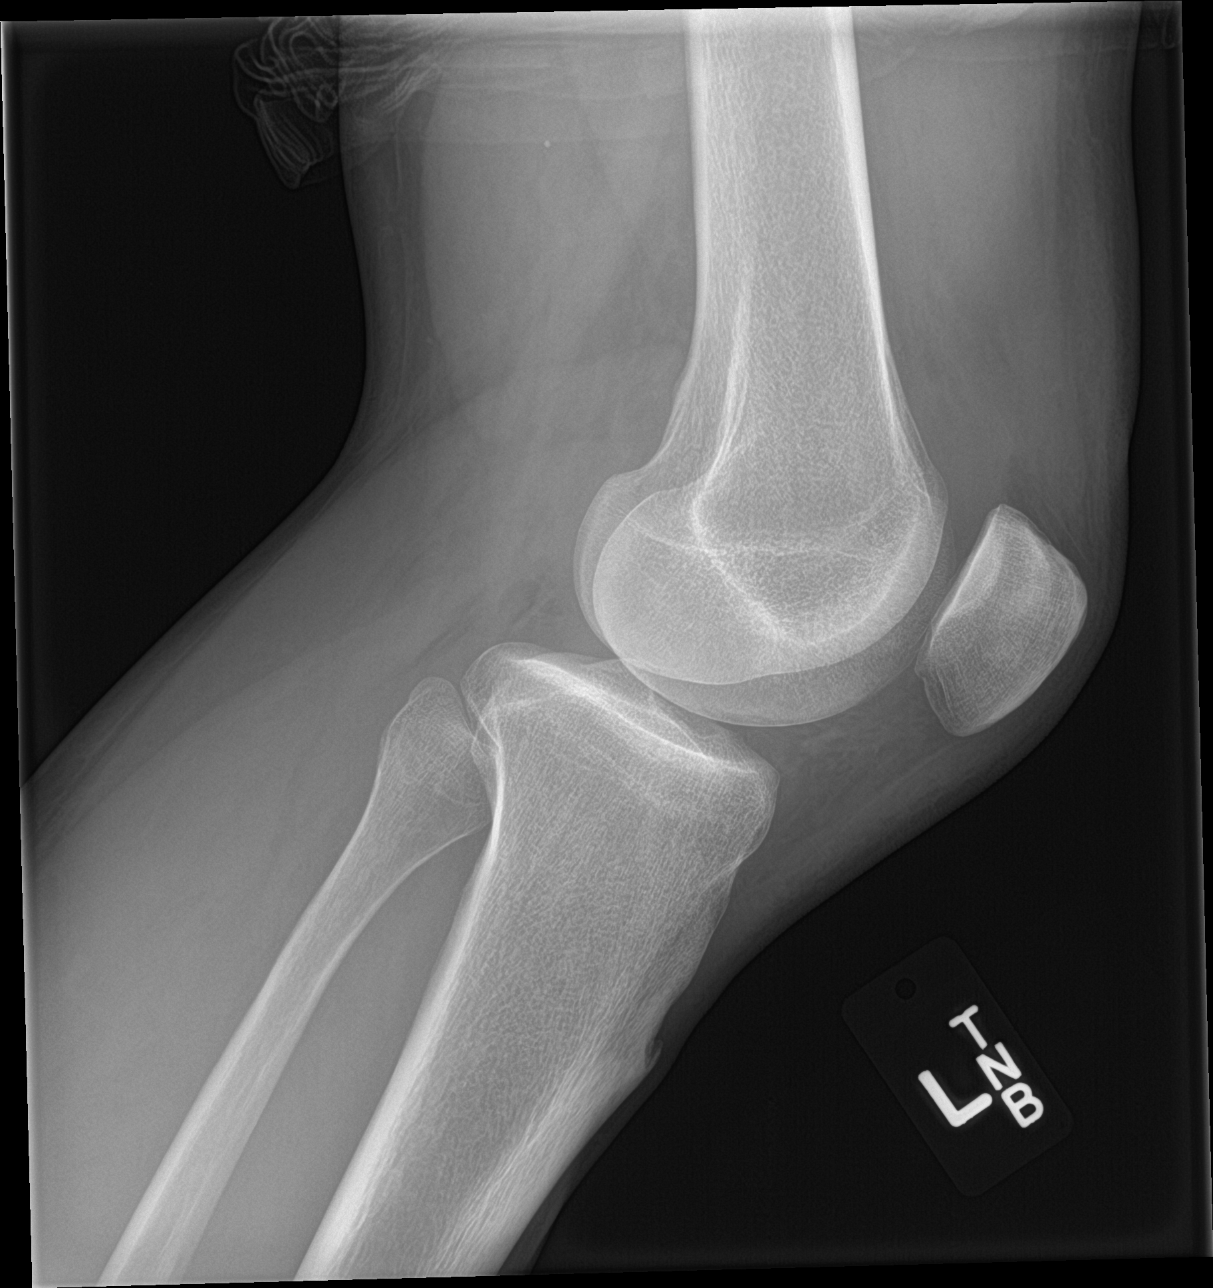

[knee sunrise]
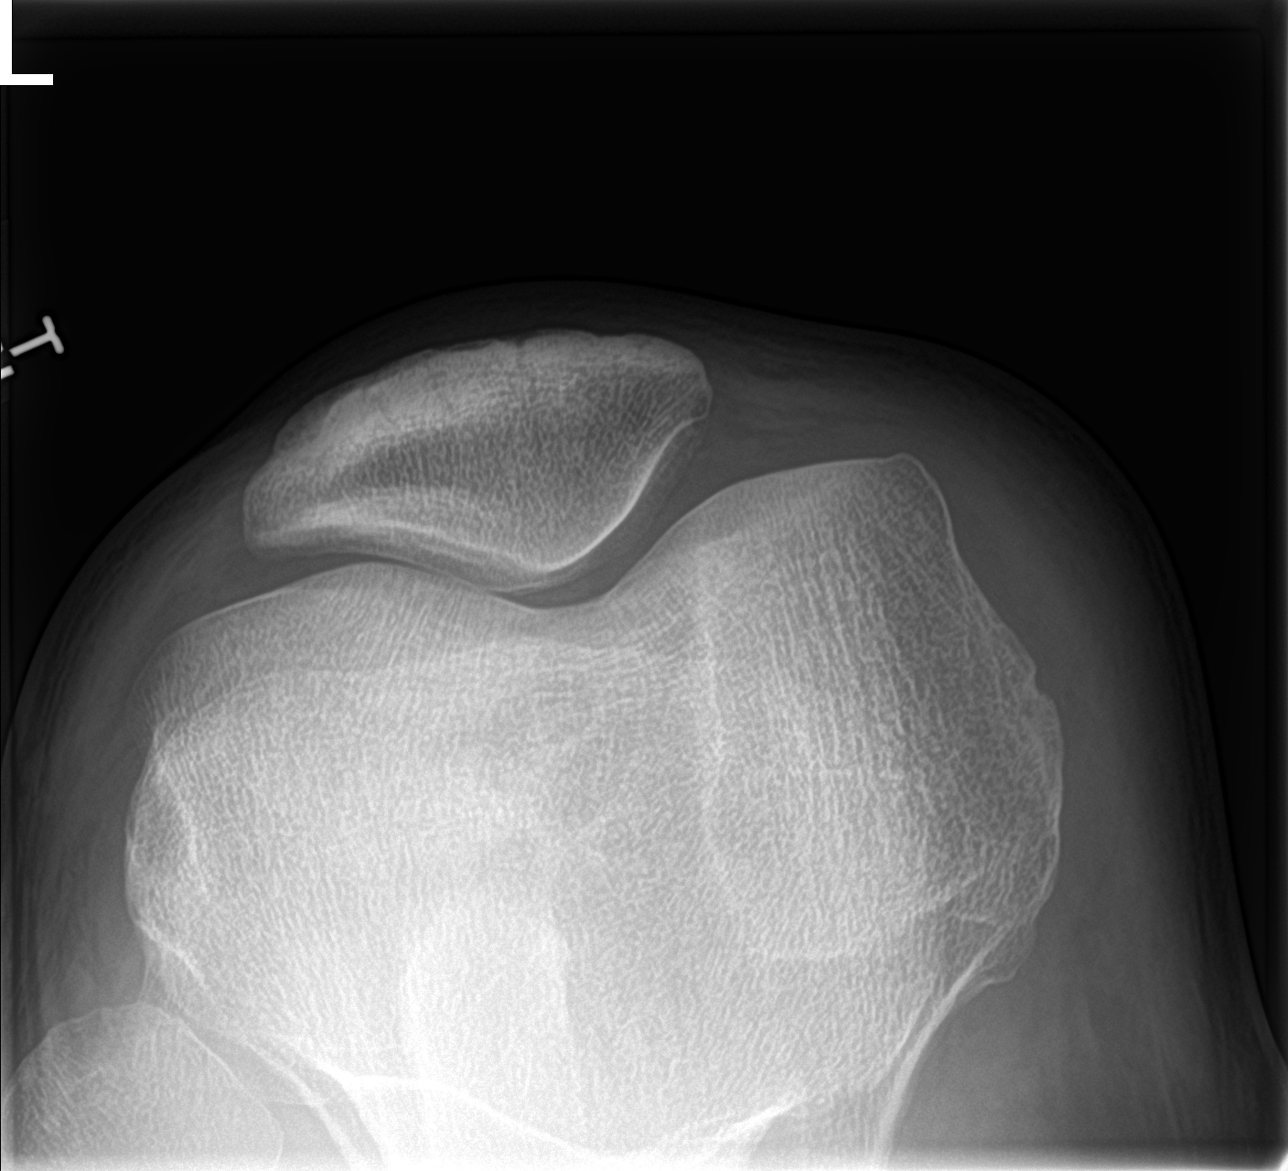

[4 of 4 positions shown; findings below may reference images not displayed]

FINDINGS: Weightbearing frontal, weight-bearing tunnel, lateral, and sunrise
patellar images were obtained. There is soft tissue swelling
medially. There is no fracture or dislocation. No joint effusion.
The joint spaces appear normal. No erosive change.
IMPRESSION: Soft tissue swelling medially. No fracture or joint effusion. No
evident arthropathy.

## 2018-05-25 ENCOUNTER — Emergency Department (HOSPITAL_COMMUNITY)
Admission: EM | Admit: 2018-05-25 | Discharge: 2018-05-25 | Disposition: A | Discharge status: Home / Self Care | Payer: Self-pay | Attending: Emergency Medicine | Admitting: Emergency Medicine

## 2018-05-25 ENCOUNTER — Other Ambulatory Visit: Payer: Self-pay

## 2018-05-25 ENCOUNTER — Encounter (HOSPITAL_COMMUNITY): Payer: Self-pay | Admitting: Emergency Medicine

## 2018-05-25 DIAGNOSIS — K029 Dental caries, unspecified: Secondary | ICD-10-CM | POA: Insufficient documentation

## 2018-05-25 DIAGNOSIS — L03211 Cellulitis of face: Secondary | ICD-10-CM | POA: Insufficient documentation

## 2018-05-25 DIAGNOSIS — K130 Diseases of lips: Secondary | ICD-10-CM | POA: Insufficient documentation

## 2018-05-25 DIAGNOSIS — F17210 Nicotine dependence, cigarettes, uncomplicated: Secondary | ICD-10-CM | POA: Insufficient documentation

## 2018-05-25 DIAGNOSIS — L089 Local infection of the skin and subcutaneous tissue, unspecified: Secondary | ICD-10-CM

## 2018-05-25 MED ORDER — CLINDAMYCIN HCL 150 MG PO CAPS: 300.0000 mg | ORAL_CAPSULE | Freq: Three times a day (TID) | ORAL | 0 refills | Status: AC

## 2018-05-25 MED ORDER — CLINDAMYCIN HCL 150 MG PO CAPS
300.0000 mg | ORAL_CAPSULE | Freq: Once | ORAL | Status: AC
  Administered 2018-05-25: 300 mg via ORAL
  Filled 2018-05-25: qty 2

## 2018-05-25 MED ORDER — ACETAMINOPHEN 325 MG PO TABS
650.0000 mg | ORAL_TABLET | Freq: Once | ORAL | Status: AC
  Administered 2018-05-25: 650 mg via ORAL
  Filled 2018-05-25: qty 2

## 2018-05-25 NOTE — ED Triage Notes (Signed)
C/o pain to left upper teeth.  Oral swelling noted for last 2 days.  Rates pain 6/10.  Took tylenol last night.

## 2018-05-25 NOTE — ED Provider Notes (Signed)
[facility] EMERGENCY DEPARTMENT Provider Note   CSN: [id number] Arrival date & time: 05/25/18  [id number]    History   Chief Complaint Chief Complaint  Patient presents with  . Dental Pain    HPI Christopher Meza is a 38 y.o. male with a history of hyperlipidemia and poor dentition presenting with left mouth and cheek pain and swelling which started 2 days ago.  He describes having a small "bump" at the corner of his left lip which he squeezed yesterday morning and a small amount of white thick drainage emerged.  Since then he has had increased pain and swelling around the site.  He does have chronic dental pain along with decay and several dental fractures but denies tooth ache or pain except for the lip and left cheek.  He has had no treatments prior to arrival.  He denies fevers or chills or any other complaints.          Dental Pain  Associated symptoms: no fever     Past Medical History:  Diagnosis Date  . Hyperlipidemia     Patient Active Problem List   Diagnosis Date Noted  . Cigarette nicotine dependence without complication 12/11/2015  . Chronic pain of left knee 12/11/2015  . Toothache 12/11/2015    History reviewed. No pertinent surgical history.      Home Medications    Prior to Admission medications   Medication Sig Start Date End Date Taking? Authorizing Provider  amoxicillin-clavulanate (AUGMENTIN) 875-125 MG tablet Take 1 tablet by mouth every 12 (twelve) hours. 10/21/17   Petrucelli, Samantha R, PA-C  clindamycin (CLEOCIN) 150 MG capsule Take 2 capsules (300 mg total) by mouth 3 (three) times daily for 7 days. 05/25/18 06/01/18  Burgess AmorIdol, Tonji Elliff, PA-C  naproxen (NAPROSYN) 500 MG tablet Take 1 tablet (500 mg total) by mouth 2 (two) times daily. 10/21/17   Petrucelli, Pleas KochSamantha R, PA-C    Family History Family History  Problem Relation Age of Onset  . Diabetes Mother   . Diabetes Maternal Grandmother     Social History Social History   Tobacco Use  .  Smoking status: Current Every Day Smoker    Packs/day: 1.00    Years: 20.00    Pack years: 20.00    Types: Cigarettes  . Smokeless tobacco: Never Used  Substance Use Topics  . Alcohol use: Yes    Comment: social  . Drug use: Yes    Types: Marijuana     Allergies   Patient has no known allergies.   Review of Systems Review of Systems  Constitutional: Negative for chills and fever.  HENT: Positive for dental problem. Negative for rhinorrhea, sore throat, trouble swallowing and voice change.   Respiratory: Negative for shortness of breath and wheezing.   Skin: Negative for color change.       Negative except as mentioned in HPI.   All other systems reviewed and are negative.    Physical Exam Updated Vital Signs BP (!) 136/95 (BP Location: Left Arm)   Pulse 99   Temp 98.2 F (36.8 C) (Oral)   Resp 18   Ht 5\' 10"  (1.778 m)   Wt 79.4 kg   SpO2 100%   BMI 25.11 kg/m   Physical Exam Vitals signs reviewed.  Constitutional:      General: He is not in acute distress.    Appearance: He is well-developed.  HENT:     Head: Normocephalic.     Nose: Nose normal.  Mouth/Throat:     Mouth: Mucous membranes are moist. No oral lesions.     Dentition: Abnormal dentition. Dental caries present. No dental tenderness, dental abscesses or gum lesions.     Tongue: No lesions.     Palate: No mass.     Pharynx: Oropharynx is clear.     Comments: There is a small open but nondraining papule at his left lip vermilion border.  There is an approximate 2 cm area of induration extending from this papule.  There is no drainage, there is no palpable fluctuance.  There is no facial erythema or increased warmth.  Sublingual space is soft. Neck:     Musculoskeletal: Neck supple.  Cardiovascular:     Rate and Rhythm: Normal rate.  Pulmonary:     Effort: Pulmonary effort is normal.  Musculoskeletal: Normal range of motion.  Skin:    Findings: Lesion present. No erythema or rash.       ED Treatments / Results  Labs (all labs ordered are listed, but only abnormal results are displayed) Labs Reviewed - No data to display  EKG None  Radiology No results found.  Procedures Procedures (including critical care time)  Medications Ordered in ED Medications  clindamycin (CLEOCIN) capsule 300 mg (300 mg Oral Given 05/25/18 1102)  acetaminophen (TYLENOL) tablet 650 mg (650 mg Oral Given 05/25/18 1102)     Initial Impression / Assessment and Plan / ED Course  I have reviewed the triage vital signs and the nursing notes.  Pertinent labs & imaging results that were available during my care of the patient were reviewed by me and considered in my medical decision making (see chart for details).        Patient with a small facial infection of his left cheek and perioral area.  There is no palpable drainable abscess.  He was placed on clindamycin, we discussed warm compresses.  We also discussed strict return precautions if symptoms are not improving over the next 48 hours with this antibiotic.  Final Clinical Impressions(s) / ED Diagnoses   Final diagnoses:  Facial infection    ED Discharge Orders         Ordered    clindamycin (CLEOCIN) 150 MG capsule  3 times daily     05/25/18 1049           Burgess Amor, PA-C 05/25/18 1242    Samuel Jester, DO 05/30/18 [phone]

## 2018-05-25 NOTE — Discharge Instructions (Addendum)
As discussed,  you may apply a gentle heating pad or warm compress at this site for 5-10 minutes throughout the day.  Take 2 more doses of the antibiotic today and make sure to take the entire 7 days of medicine.  As discussed,  if this site is not responding to this treatment within the next 1-2 days or for any worsened symptoms, return for a recheck as this may need to be lanced if the antibiotics do not resolve this infection.  Avoid squeezing the site.

## 2018-11-11 ENCOUNTER — Other Ambulatory Visit: Payer: Self-pay

## 2018-11-11 DIAGNOSIS — Z20822 Contact with and (suspected) exposure to covid-19: Secondary | ICD-10-CM

## 2018-11-12 LAB — NOVEL CORONAVIRUS, NAA: SARS-CoV-2, NAA: NOT DETECTED

## 2019-01-17 ENCOUNTER — Other Ambulatory Visit: Payer: Self-pay

## 2019-01-17 ENCOUNTER — Ambulatory Visit: Payer: Self-pay | Attending: Internal Medicine

## 2019-01-17 DIAGNOSIS — U071 COVID-19: Secondary | ICD-10-CM | POA: Insufficient documentation

## 2019-01-17 DIAGNOSIS — Z20822 Contact with and (suspected) exposure to covid-19: Secondary | ICD-10-CM

## 2019-01-19 ENCOUNTER — Telehealth: Payer: Self-pay

## 2019-01-19 NOTE — Telephone Encounter (Signed)
Informed pt results were still pending.   Joycelyn Rua Hopkins

## 2019-01-20 LAB — NOVEL CORONAVIRUS, NAA: SARS-CoV-2, NAA: DETECTED — AB

## 2019-01-24 ENCOUNTER — Telehealth: Payer: Self-pay | Admitting: *Deleted

## 2019-01-24 NOTE — Telephone Encounter (Signed)
Pt called to discuss guidelines for quarantine and re-test for COVID; he tested 01/17/19; pt advised: If you had a COVID-19 test with a positive result (COVID detected) we recommend no further testing or retesting for 90 days from the date of the original test. There may be circumstance that require a retest prior to the end of the 90 day window. If you feel you need a COVID-19 retest please contact your primary care provider; pt also given guidelines to end quarantine: All persons with fever and respiratory symptoms should isolate themselves until ALL conditions listed below are met: - at least 10 days since symptoms onset - AND 3 consecutive days fever free without antipyretics (acetaminophen [Tylenol] or ibuprofen [Advil]) AND improvement in respiratory symptoms; the pt states he did not have symptomsPt advised to contact his PCP, and HR for return to work guidelines; he verbalized understanding.

## 2019-01-26 ENCOUNTER — Ambulatory Visit: Payer: Self-pay | Attending: Internal Medicine

## 2019-01-26 ENCOUNTER — Other Ambulatory Visit: Payer: Self-pay

## 2019-01-26 DIAGNOSIS — Z20822 Contact with and (suspected) exposure to covid-19: Secondary | ICD-10-CM | POA: Insufficient documentation

## 2019-01-28 LAB — NOVEL CORONAVIRUS, NAA: SARS-CoV-2, NAA: NOT DETECTED

## 2019-02-20 ENCOUNTER — Ambulatory Visit: Payer: Self-pay | Attending: Internal Medicine

## 2019-02-20 ENCOUNTER — Other Ambulatory Visit: Payer: Self-pay

## 2019-02-20 DIAGNOSIS — Z20822 Contact with and (suspected) exposure to covid-19: Secondary | ICD-10-CM | POA: Insufficient documentation

## 2019-02-21 LAB — NOVEL CORONAVIRUS, NAA: SARS-CoV-2, NAA: NOT DETECTED

## 2019-03-28 ENCOUNTER — Emergency Department (HOSPITAL_COMMUNITY)
Admission: EM | Admit: 2019-03-28 | Discharge: 2019-03-28 | Disposition: A | Discharge status: Home / Self Care | Payer: Self-pay | Attending: Emergency Medicine | Admitting: Emergency Medicine

## 2019-03-28 ENCOUNTER — Encounter (HOSPITAL_COMMUNITY): Payer: Self-pay | Admitting: *Deleted

## 2019-03-28 ENCOUNTER — Other Ambulatory Visit: Payer: Self-pay

## 2019-03-28 DIAGNOSIS — R519 Headache, unspecified: Secondary | ICD-10-CM | POA: Insufficient documentation

## 2019-03-28 DIAGNOSIS — J302 Other seasonal allergic rhinitis: Secondary | ICD-10-CM | POA: Insufficient documentation

## 2019-03-28 DIAGNOSIS — R0981 Nasal congestion: Secondary | ICD-10-CM | POA: Insufficient documentation

## 2019-03-28 DIAGNOSIS — F1210 Cannabis abuse, uncomplicated: Secondary | ICD-10-CM | POA: Insufficient documentation

## 2019-03-28 DIAGNOSIS — F17210 Nicotine dependence, cigarettes, uncomplicated: Secondary | ICD-10-CM | POA: Insufficient documentation

## 2019-03-28 NOTE — ED Triage Notes (Signed)
Nasal congestion with headache

## 2019-03-28 NOTE — ED Triage Notes (Signed)
Needs note for work.

## 2019-03-28 NOTE — Discharge Instructions (Signed)
Your symptoms are likely due to seasonal allergies, please take Zyrtec 1 tablet daily, and use Flonase 1 spray in each nostril daily to help with your symptoms.  These medications can both be purchased over-the-counter.  If you develop fevers, cough or other new or concerning symptoms you can follow-up with your PCP or return for further evaluation.

## 2019-03-28 NOTE — ED Provider Notes (Signed)
[facility] EMERGENCY DEPARTMENT Provider Note   CSN: [id number] Arrival date & time: 03/28/19  1416     History Chief Complaint  Patient presents with  . Nasal Congestion    Christopher Meza is a 39 y.o. male.  [name] is a 39 y.o. male with a history of hyperlipidemia and tobacco use, who presents to the emergency department for evaluation of nasal congestion.  He reports over the past few days his nose has been very stuffed up, and he has had some sinus pressure.  Patient states that he usually gets the symptoms around this time of year every year and takes allergy medication for it.  He has not taken any medications to treat his symptoms, denies any associated fevers or chills.  No cough, no sore throat.  Denies any chest pain or shortness of breath.  No abdominal pain, nausea or vomiting.  Reports a slight headache described as sinus pressure, but no other headaches.  He states that his boss told him he should go get checked out and that is the only reason he came in today.  His boss did not tell him that he needed to have a Covid test before coming in he has not had any Covid exposures that he knows of and does not want a Covid test today.  No other aggravating or alleviating factors.        Past Medical History:  Diagnosis Date  . Hyperlipidemia     Patient Active Problem List   Diagnosis Date Noted  . Cigarette nicotine dependence without complication 08/67/6195  . Chronic pain of left knee 12/11/2015  . Toothache 12/11/2015    History reviewed. No pertinent surgical history.     Family History  Problem Relation Age of Onset  . Diabetes Mother   . Diabetes Maternal Grandmother     Social History   Tobacco Use  . Smoking status: Current Every Day Smoker    Packs/day: 1.00    Years: 20.00    Pack years: 20.00    Types: Cigarettes  . Smokeless tobacco: Never Used  Substance Use Topics  . Alcohol use: Yes    Comment: social  . Drug use: Yes    Types:  Marijuana    Home Medications Prior to Admission medications   Medication Sig Start Date End Date Taking? Authorizing Provider  amoxicillin-clavulanate (AUGMENTIN) 875-125 MG tablet Take 1 tablet by mouth every 12 (twelve) hours. 10/21/17   Petrucelli, Samantha R, PA-C  naproxen (NAPROSYN) 500 MG tablet Take 1 tablet (500 mg total) by mouth 2 (two) times daily. 10/21/17   Petrucelli, Glynda Jaeger, PA-C    Allergies    Patient has no known allergies.  Review of Systems   Review of Systems  Constitutional: Negative for chills and fever.  HENT: Positive for congestion and sinus pressure. Negative for postnasal drip, rhinorrhea and sore throat.   Respiratory: Negative for cough and shortness of breath.   Cardiovascular: Negative for chest pain.  Gastrointestinal: Negative for abdominal pain, diarrhea and vomiting.  Musculoskeletal: Negative for myalgias.  Skin: Negative for color change and rash.  Neurological: Negative for headaches.    Physical Exam Updated Vital Signs BP 125/76   Pulse 98   Temp 98.1 F (36.7 C) (Oral)   Resp 20   SpO2 98%   Physical Exam Vitals and nursing note reviewed.  Constitutional:      General: He is not in acute distress.    Appearance: Normal appearance. He is well-developed  and normal weight. He is not ill-appearing or diaphoretic.  HENT:     Head: Normocephalic and atraumatic.     Nose: Congestion present.     Comments: Congestion noted with mildly erythematous nasal mucosa, mild tenderness over the sinuses    Mouth/Throat:     Mouth: Mucous membranes are moist.     Pharynx: Oropharynx is clear. No oropharyngeal exudate or posterior oropharyngeal erythema.     Comments: Posterior oropharynx clear and moist without erythema or exudates Eyes:     General:        Right eye: No discharge.        Left eye: No discharge.  Neck:     Comments: No rigidity Cardiovascular:     Rate and Rhythm: Normal rate and regular rhythm.     Heart sounds:  Normal heart sounds.  Pulmonary:     Effort: Pulmonary effort is normal. No respiratory distress.     Breath sounds: Normal breath sounds.     Comments: Respirations equal and unlabored, patient able to speak in full sentences, lungs clear to auscultation bilaterally Abdominal:     General: Bowel sounds are normal. There is no distension.     Palpations: Abdomen is soft. There is no mass.     Tenderness: There is no abdominal tenderness. There is no guarding.     Comments: Abdomen soft, nondistended, nontender to palpation in all quadrants without guarding or peritoneal signs  Musculoskeletal:        General: No deformity.     Cervical back: Neck supple.  Lymphadenopathy:     Cervical: No cervical adenopathy.  Skin:    General: Skin is warm and dry.     Capillary Refill: Capillary refill takes less than 2 seconds.  Neurological:     Mental Status: He is alert.     ED Results / Procedures / Treatments   Labs (all labs ordered are listed, but only abnormal results are displayed) Labs Reviewed - No data to display  EKG None  Radiology No results found.  Procedures Procedures (including critical care time)  Medications Ordered in ED Medications - No data to display  ED Course  I have reviewed the triage vital signs and the nursing notes.  Pertinent labs & imaging results that were available during my care of the patient were reviewed by me and considered in my medical decision making (see chart for details).    MDM Rules/Calculators/A&P                      39 year old male presents with nasal congestion over the past few days associated with some sinus pressure, no associated cough, fevers or chills, no known sick contacts.  States that he gets similar congestion every year and he thinks this is likely his allergies he has not started taking any medications for it, his boss recommended he come in for evaluation, did not tell him that he needed to have a Covid test done,  Covid testing was offered but patient declined, I feel this is reasonable and feel this is less likely Covid much more likely allergic rhinitis.  Recommend treatment with Zyrtec and Flonase.  PCP follow-up encouraged.  Patient given note to return to work.  Final Clinical Impression(s) / ED Diagnoses Final diagnoses:  Nasal congestion  Seasonal allergies    Rx / DC Orders ED Discharge Orders    None       Dartha Lodge, [place] 03/28/19 1648  Milagros Loll, MD 03/28/19 2342

## 2019-06-07 ENCOUNTER — Other Ambulatory Visit: Payer: Self-pay

## 2019-06-07 ENCOUNTER — Emergency Department (HOSPITAL_COMMUNITY)
Admission: EM | Admit: 2019-06-07 | Discharge: 2019-06-07 | Disposition: A | Discharge status: Home / Self Care | Payer: Self-pay | Attending: Emergency Medicine | Admitting: Emergency Medicine

## 2019-06-07 ENCOUNTER — Encounter (HOSPITAL_COMMUNITY): Payer: Self-pay

## 2019-06-07 DIAGNOSIS — K0889 Other specified disorders of teeth and supporting structures: Secondary | ICD-10-CM | POA: Insufficient documentation

## 2019-06-07 DIAGNOSIS — F17210 Nicotine dependence, cigarettes, uncomplicated: Secondary | ICD-10-CM | POA: Insufficient documentation

## 2019-06-07 MED ORDER — AMOXICILLIN-POT CLAVULANATE 875-125 MG PO TABS: 1.0000 | ORAL_TABLET | Freq: Two times a day (BID) | ORAL | 0 refills | Status: DC

## 2019-06-07 MED ORDER — NAPROXEN 500 MG PO TABS: 500.0000 mg | ORAL_TABLET | Freq: Two times a day (BID) | ORAL | 0 refills | Status: DC

## 2019-06-07 NOTE — Discharge Instructions (Addendum)
Please take entire course of antibiotics as directed.  Continue using naprosyn and Tylenol, as well as Orajel for pain.  You will need to follow-up with your dentist for continued management of this.  Return to the emergency department for fevers, swelling or pain under the tongue or in the neck, difficulty breathing or swallowing or any other new or concerning symptoms.

## 2019-06-07 NOTE — ED Triage Notes (Signed)
Pt presents to ED with complaints of upper right dental pain and mouth swelling x couple of days. Pt does not have dentist.

## 2019-06-07 NOTE — ED Provider Notes (Signed)
[facility] EMERGENCY DEPARTMENT Provider Note   CSN: [id number] Arrival date & time: 06/07/19  1309     History Chief Complaint  Patient presents with  . Dental Pain    Christopher Meza is a 39 y.o. male.  Christopher Meza is a 39 y.o. male with a history of hyperlipidemia, tobacco abuse, who presents to the ED for evaluation of dental pain.  He reports for the past few days he has noted pain over his right upper jaw where he has a broken tooth.  He reports that over the past 2 days he has noted swelling over the upper jaw.  Pain is worse with palpation and chewing.  He denies any difficulty swallowing.  Denies any pain underneath his tongue, no neck pain or swelling.  No fevers or chills.  No nausea or vomiting.  No meds prior to arrival.  Reports he had a similar issue on the left side a few years ago, was initially put on antibiotics and then had to have a tooth pulled, he thinks this is what needs to happen again, but does not currently have dental insurance.  No other aggravating or alleviating factors.        Past Medical History:  Diagnosis Date  . Hyperlipidemia     Patient Active Problem List   Diagnosis Date Noted  . Cigarette nicotine dependence without complication 50/93/2671  . Chronic pain of left knee 12/11/2015  . Toothache 12/11/2015    History reviewed. No pertinent surgical history.     Family History  Problem Relation Age of Onset  . Diabetes Mother   . Diabetes Maternal Grandmother     Social History   Tobacco Use  . Smoking status: Current Every Day Smoker    Packs/day: 1.00    Years: 20.00    Pack years: 20.00    Types: Cigarettes  . Smokeless tobacco: Never Used  Substance Use Topics  . Alcohol use: Yes    Comment: social  . Drug use: Yes    Types: Marijuana    Home Medications Prior to Admission medications   Medication Sig Start Date End Date Taking? Authorizing Provider  amoxicillin-clavulanate (AUGMENTIN) 875-125 MG tablet Take 1  tablet by mouth every 12 (twelve) hours. 06/07/19   Jacqlyn Larsen, PA-C  naproxen (NAPROSYN) 500 MG tablet Take 1 tablet (500 mg total) by mouth 2 (two) times daily. 06/07/19   Jacqlyn Larsen, PA-C    Allergies    Patient has no known allergies.  Review of Systems   Review of Systems  Constitutional: Negative for chills and fever.  HENT: Positive for dental problem and facial swelling. Negative for sore throat and trouble swallowing.   Gastrointestinal: Negative for nausea and vomiting.  Musculoskeletal: Negative for neck pain and neck stiffness.  Skin: Negative for color change.    Physical Exam Updated Vital Signs BP 139/84 (BP Location: Right Arm)   Pulse (!) 109   Temp 97.9 F (36.6 C) (Oral)   Resp 16   Ht 5\' 10"  (1.778 m)   Wt 79.4 kg   SpO2 97%   BMI 25.11 kg/m   Physical Exam Vitals and nursing note reviewed.  Constitutional:      General: He is not in acute distress.    Appearance: Normal appearance. He is well-developed and normal weight. He is not diaphoretic.  HENT:     Head: Normocephalic and atraumatic.     Mouth/Throat:     Comments: Swelling over the right upper  jaw with mild tenderness, no palpable fluctuance, there is a broken and decaying tooth with erythema of the surrounding gums but no obvious drainable abscess, no sublingual tenderness, posterior oropharynx is clear, normal phonation, tolerating secretions Eyes:     General:        Right eye: No discharge.        Left eye: No discharge.  Neck:     Comments: No torticollis, no neck tenderness or masses Pulmonary:     Effort: Pulmonary effort is normal. No respiratory distress.  Musculoskeletal:        General: No deformity.     Cervical back: Neck supple.  Skin:    General: Skin is warm and dry.  Neurological:     Mental Status: He is alert and oriented to person, place, and time.     Coordination: Coordination normal.  Psychiatric:        Mood and Affect: Mood normal.        Behavior:  Behavior normal.     ED Results / Procedures / Treatments   Labs (all labs ordered are listed, but only abnormal results are displayed) Labs Reviewed - No data to display  EKG None  Radiology No results found.  Procedures Procedures (including critical care time)  Medications Ordered in ED Medications - No data to display  ED Course  I have reviewed the triage vital signs and the nursing notes.  Pertinent labs & imaging results that were available during my care of the patient were reviewed by me and considered in my medical decision making (see chart for details).    MDM Rules/Calculators/A&P                      Patient with toothache.  No gross abscess.  Exam unconcerning for Ludwig's angina or spread of infection.  Will treat with penicillin and anti-inflammatories medicine.  Urged patient to follow-up with dentist.    Final Clinical Impression(s) / ED Diagnoses Final diagnoses:  Pain, dental    Rx / DC Orders ED Discharge Orders         Ordered    amoxicillin-clavulanate (AUGMENTIN) 875-125 MG tablet  Every 12 hours     06/07/19 1501    naproxen (NAPROSYN) 500 MG tablet  2 times daily     06/07/19 1501           Legrand Rams 06/07/19 1502    Raeford Razor, MD 06/10/19 1724

## 2019-08-16 ENCOUNTER — Emergency Department (HOSPITAL_COMMUNITY)
Admission: EM | Admit: 2019-08-16 | Discharge: 2019-08-16 | Disposition: A | Discharge status: Home / Self Care | Payer: Self-pay | Attending: Emergency Medicine | Admitting: Emergency Medicine

## 2019-08-16 ENCOUNTER — Encounter (HOSPITAL_COMMUNITY): Payer: Self-pay

## 2019-08-16 ENCOUNTER — Other Ambulatory Visit: Payer: Self-pay

## 2019-08-16 DIAGNOSIS — E876 Hypokalemia: Secondary | ICD-10-CM | POA: Insufficient documentation

## 2019-08-16 DIAGNOSIS — R61 Generalized hyperhidrosis: Secondary | ICD-10-CM | POA: Insufficient documentation

## 2019-08-16 DIAGNOSIS — T40601A Poisoning by unspecified narcotics, accidental (unintentional), initial encounter: Secondary | ICD-10-CM | POA: Insufficient documentation

## 2019-08-16 DIAGNOSIS — F17210 Nicotine dependence, cigarettes, uncomplicated: Secondary | ICD-10-CM | POA: Insufficient documentation

## 2019-08-16 LAB — BASIC METABOLIC PANEL
Anion gap: 13 (ref 5–15)
BUN: 11 mg/dL (ref 6–20)
CO2: 24 mmol/L (ref 22–32)
Calcium: 8.9 mg/dL (ref 8.9–10.3)
Chloride: 102 mmol/L (ref 98–111)
Creatinine, Ser: 0.89 mg/dL (ref 0.61–1.24)
GFR calc Af Amer: >60 mL/min (ref >60)
GFR calc non Af Amer: >60 mL/min (ref >60)
Glucose, Bld: 229 mg/dL — ABNORMAL HIGH (ref 70–99)
Potassium: 2.9 mmol/L — ABNORMAL LOW (ref 3.5–5.1)
Sodium: 139 mmol/L (ref 135–145)

## 2019-08-16 LAB — CBC WITH DIFFERENTIAL/PLATELET
Abs Immature Granulocytes: 0.03 10*3/uL (ref 0.00–0.07)
Basophils Absolute: 0.1 10*3/uL (ref 0.0–0.1)
Basophils Relative: 1 %
Eosinophils Absolute: 0.4 10*3/uL (ref 0.0–0.5)
Eosinophils Relative: 4 %
HCT: 44.9 % (ref 39.0–52.0)
Hemoglobin: 14.6 g/dL (ref 13.0–17.0)
Immature Granulocytes: 0 %
Lymphocytes Relative: 38 %
Lymphs Abs: 4 10*3/uL (ref 0.7–4.0)
MCH: 33.4 pg (ref 26.0–34.0)
MCHC: 32.5 g/dL (ref 30.0–36.0)
MCV: 102.7 fL — ABNORMAL HIGH (ref 80.0–100.0)
Monocytes Absolute: 0.8 10*3/uL (ref 0.1–1.0)
Monocytes Relative: 8 %
Neutro Abs: 5.3 10*3/uL (ref 1.7–7.7)
Neutrophils Relative %: 49 %
Platelets: 306 10*3/uL (ref 150–400)
RBC: 4.37 MIL/uL (ref 4.22–5.81)
RDW: 11.8 % (ref 11.5–15.5)
WBC: 10.6 10*3/uL — ABNORMAL HIGH (ref 4.0–10.5)
nRBC: 0 % (ref 0.0–0.2)

## 2019-08-16 LAB — RAPID URINE DRUG SCREEN, HOSP PERFORMED
Amphetamines: NOT DETECTED
Barbiturates: NOT DETECTED
Benzodiazepines: NOT DETECTED
Cocaine: POSITIVE — AB
Opiates: NOT DETECTED
Tetrahydrocannabinol: POSITIVE — AB

## 2019-08-16 MED ORDER — POTASSIUM CHLORIDE CRYS ER 20 MEQ PO TBCR
20.0000 meq | EXTENDED_RELEASE_TABLET | Freq: Once | ORAL | Status: AC
  Administered 2019-08-16: 20 meq via ORAL
  Filled 2019-08-16: qty 1

## 2019-08-16 MED ORDER — ONDANSETRON HCL 4 MG/2ML IJ SOLN
4.0000 mg | Freq: Once | INTRAMUSCULAR | Status: AC
  Administered 2019-08-16: 4 mg via INTRAVENOUS
  Filled 2019-08-16: qty 2

## 2019-08-16 MED ORDER — ONDANSETRON HCL 4 MG PO TABS
4.0000 mg | ORAL_TABLET | Freq: Three times a day (TID) | ORAL | 0 refills | Status: DC | PRN
Start: 2019-08-16 — End: 2020-05-12

## 2019-08-16 NOTE — ED Triage Notes (Signed)
Pt brought to ED via [facility] for drug overdose, pt admits to using cocaine. 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 Pt was noted to have pinpoint pupils. CBG 209.

## 2019-08-16 NOTE — ED Provider Notes (Signed)
Christopher Meza   CSN: [id number] Arrival date & time: 08/16/19  1156     History Chief Complaint  Patient presents with  . Drug Overdose    Christopher Meza is a 39 y.o. male.  He has no significant past medical history.  History is given by EMS.  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  He then became unresponsive.  Fire department found him unresponsive and not breathing and so began giving him oxygen via Ambu bag.  This was a couple of minutes.  He then received intranasal Narcan.  Reportedly pinpoint pupils.  EMS found the patient beginning to respond and breathing on his own.  They transported him here without difficulties.  Blood sugar 209.  Patient is drowsy but will answer questions.  Denies any intent to harm himself and believes he snorted cocaine.  States this is never happened to him before.  The history is provided by the patient and the EMS personnel.  Drug Overdose This is a new problem. The current episode started less than 1 hour ago. The problem has been gradually improving. Pertinent negatives include no chest pain, no abdominal pain, no headaches and no shortness of breath. Nothing aggravates the symptoms. Relieved by: narcan, oxygen. The treatment provided moderate relief.       Past Medical History:  Diagnosis Date  . Hyperlipidemia     Patient Active Problem List   Diagnosis Date Noted  . Cigarette nicotine dependence without complication 12/11/2015  . Chronic pain of left knee 12/11/2015  . Toothache 12/11/2015    History reviewed. No pertinent surgical history.     Family History  Problem Relation Age of Onset  . Diabetes Mother   . Diabetes Maternal Grandmother     Social History   Tobacco Use  . Smoking status: Current Every Day Smoker    Packs/day: 1.00    Years: 20.00    Pack years: 20.00    Types: Cigarettes  . Smokeless tobacco: Never Used  Substance Use Topics  . Alcohol use:  Yes    Comment: social  . Drug use: Yes    Types: Marijuana, Cocaine    Home Medications Prior to Admission medications   Medication Sig Start Date End Date Taking? Authorizing Provider  amoxicillin-clavulanate (AUGMENTIN) 875-125 MG tablet Take 1 tablet by mouth every 12 (twelve) hours. 06/07/19   Dartha Lodge, PA-C  naproxen (NAPROSYN) 500 MG tablet Take 1 tablet (500 mg total) by mouth 2 (two) times daily. 06/07/19   Dartha Lodge, PA-C    Allergies    Patient has no known allergies.  Review of Systems   Review of Systems  Constitutional: Positive for diaphoresis. Negative for fever.  HENT: Negative for sore throat.   Eyes: Negative for visual disturbance.  Respiratory: Negative for shortness of breath.   Cardiovascular: Negative for chest pain.  Gastrointestinal: Negative for abdominal pain.  Genitourinary: Negative for dysuria.  Musculoskeletal: Negative for neck pain.  Skin: Negative for rash.  Neurological: Negative for headaches.    Physical Exam Updated Vital Signs BP 127/87   Pulse 73   Temp (!) 97.4 F (36.3 C) (Oral)   Resp 16   Ht 5\' 9"  (1.753 m)   Wt 77.1 kg   SpO2 94%   BMI 25.10 kg/m   Physical Exam Vitals and nursing Meza reviewed.  Constitutional:      Appearance: Normal appearance. He is well-developed. He is diaphoretic.  HENT:     Head: Normocephalic and atraumatic.  Eyes:     Conjunctiva/sclera: Conjunctivae normal.  Cardiovascular:     Rate and Rhythm: Normal rate and regular rhythm.     Heart sounds: No murmur heard.   Pulmonary:     Effort: Pulmonary effort is normal. No respiratory distress.     Breath sounds: Normal breath sounds.  Abdominal:     Palpations: Abdomen is soft.     Tenderness: There is no abdominal tenderness.  Musculoskeletal:        General: No deformity or signs of injury. Normal range of motion.     Cervical back: Neck supple.  Skin:    General: Skin is warm.     Capillary Refill: Capillary refill takes  less than 2 seconds.  Neurological:     General: No focal deficit present.     Mental Status: He is oriented to person, place, and time and easily aroused.  Psychiatric:        Behavior: Behavior is cooperative.     ED Results / Procedures / Treatments   Labs (all labs ordered are listed, but only abnormal results are displayed) Labs Reviewed  BASIC METABOLIC PANEL - Abnormal; Notable for the following components:      Result Value   Potassium 2.9 (*)    Glucose, Bld 229 (*)    All other components within normal limits  CBC WITH DIFFERENTIAL/PLATELET - Abnormal; Notable for the following components:   WBC 10.6 (*)    MCV 102.7 (*)    All other components within normal limits  RAPID URINE DRUG SCREEN, HOSP PERFORMED - Abnormal; Notable for the following components:   Cocaine POSITIVE (*)    Tetrahydrocannabinol POSITIVE (*)    All other components within normal limits    EKG EKG Interpretation  Date/Time:  Wednesday August 16 2019 12:08:31 EDT Ventricular Rate:  83 PR Interval:    QRS Duration: 104 QT Interval:  411 QTC Calculation: 483 R Axis:   66 Text Interpretation: Sinus rhythm Borderline prolonged QT interval No old tracing to compare Confirmed by Meridee Score [phone]) on 08/16/2019 12:15:53 PM   Radiology No results found.  Procedures Procedures (including critical care time)  Medications Ordered in ED Medications  potassium chloride SA (KLOR-CON) CR tablet 20 mEq (20 mEq Oral Given 08/16/19 1320)  ondansetron (ZOFRAN) injection 4 mg (4 mg Intravenous Given 08/16/19 1525)    ED Course  I have reviewed the triage vital signs and the nursing notes.  Pertinent labs & imaging results that were available during my care of the patient were reviewed by me and considered in my medical decision making (see chart for details).  Clinical Course as of Aug 17 938  Wed Aug 16, 2019  1333 Reevaluated patient.  He remains sleepy but is saturating okay and breathing on  his own.   [MB]  1507 Patient here is now awake and alert.  His fiance is here to take him home.  I think that is reasonable that he can be discharged now.   [MB]  1519 Patient now vomiting.  Will give some Zofran.   [MB]    Clinical Course User Index [MB] Terrilee Files, MD   MDM Rules/Calculators/A&P                         This patient complains of unresponsive, possible overdose; this involves an extensive number of treatment Options and is a complaint  that carries with it a high risk of complications and Morbidity. The differential includes opiate overdose, polysubstance overdose, metabolic derangement, seizure, stroke, ACS  I ordered, reviewed and interpreted labs, which included CBC with elevated white count, normal hemoglobin, chemistries with low potassium and elevated glucose, tox screen with positive for cocaine and THC I ordered medication Zofran for nausea and potassium for hypokalemia Additional history obtained from EMS Previous records obtained and reviewed in epic, none   After the interventions stated above, I reevaluated the patient and found patient to remain awake and not requiring oxygen.  He remains intermittently nauseous so we will discharge with a prescription for Zofran.  Return instructions discussed  Final Clinical Impression(s) / ED Diagnoses Final diagnoses:  Opiate overdose, accidental or unintentional, initial encounter (HCC)  Hypokalemia    Rx / DC Orders ED Discharge Orders         Ordered    ondansetron (ZOFRAN) 4 MG tablet  Every 8 hours PRN     Discontinue  Reprint     08/16/19 1520           Terrilee Files, MD 08/17/19 [phone]

## 2019-08-16 NOTE — Discharge Instructions (Addendum)
You were seen in the emergency department for evaluation after being unresponsive.  You remained alert and in no distress.  Your lab work did not show any serious findings.  Please consider help with your drug use.  Return to the emergency department if any worsening or concerning symptoms.

## 2020-05-12 ENCOUNTER — Other Ambulatory Visit: Payer: Self-pay

## 2020-05-12 ENCOUNTER — Emergency Department
Admission: EM | Admit: 2020-05-12 | Discharge: 2020-05-12 | Disposition: A | Discharge status: Home / Self Care | Payer: Self-pay | Attending: Emergency Medicine | Admitting: Emergency Medicine

## 2020-05-12 DIAGNOSIS — U071 COVID-19: Secondary | ICD-10-CM | POA: Insufficient documentation

## 2020-05-12 DIAGNOSIS — F17210 Nicotine dependence, cigarettes, uncomplicated: Secondary | ICD-10-CM | POA: Insufficient documentation

## 2020-05-12 NOTE — ED Provider Notes (Addendum)
[facility] Emergency Department Provider Note ____________________________________________  Time seen: 1300  I have reviewed the triage vital signs and the nursing notes.  HISTORY  Chief Complaint  covid test   HPI Christopher Meza is a 40 y.o. male presents to the ER today with complaint of dry cough.  He reports this started 6 days ago.  He did test positive for COVID at [facility] on 4/25.  He denies headaches, dizziness, visual changes, runny nose, nasal congestion, ear pain, sore throat, shortness of breath, nausea, vomiting or diarrhea.  He is not currently taking anything OTC for his symptoms.  He reports his work is insisting on a repeat COVID test.  Past Medical History:  Diagnosis Date  . Hyperlipidemia     Patient Active Problem List   Diagnosis Date Noted  . Cigarette nicotine dependence without complication 12/11/2015  . Chronic pain of left knee 12/11/2015  . Toothache 12/11/2015    History reviewed. No pertinent surgical history.  Prior to Admission medications   Not on File    Allergies Patient has no known allergies.  Family History  Problem Relation Age of Onset  . Diabetes Mother   . Diabetes Maternal Grandmother     Social History Social History   Tobacco Use  . Smoking status: Current Every Day Smoker    Packs/day: 1.00    Years: 20.00    Pack years: 20.00    Types: Cigarettes  . Smokeless tobacco: Never Used  Substance Use Topics  . Alcohol use: Yes    Comment: social  . Drug use: Yes    Types: Marijuana, Cocaine    Review of Systems  Constitutional: Negative for fever, chills or body aches. Eyes: Negative for visual changes. ENT: Negative for runny nose, nasal congestion, ear pain or sore throat. Cardiovascular: Negative for chest pain or chest tightness. Respiratory: Positive for cough.  Negative for shortness of breath. Gastrointestinal: Negative for nausea, vomiting and diarrhea. Skin: Negative for  rash. Neurological: Negative for headaches or dizziness. ____________________________________________  PHYSICAL EXAM:  VITAL SIGNS: ED Triage Vitals  Enc Vitals Group     BP 05/12/20 1257 135/70     Pulse Rate 05/12/20 1257 (!) 108     Resp 05/12/20 1257 18     Temp 05/12/20 1257 98.6 F (37 C)     Temp Source 05/12/20 1257 Oral     SpO2 05/12/20 1257 95 %     Weight 05/12/20 1300 169 lb 12.1 oz (77 kg)     Height 05/12/20 1300 5\' 9"  (1.753 m)     Head Circumference --      Peak Flow --      Pain Score 05/12/20 1300 0     Pain Loc --      Pain Edu? --      Excl. in GC? --     Constitutional: Alert and oriented. Well appearing and in no distress. Head: Normocephalic. Eyes: Sclera white.  Conjunctivae are normal. Normal extraocular movements Hematological/Lymphatic/Immunological: No cervical lymphadenopathy. Cardiovascular: Tachycardic, regular rhythm.  Respiratory: Normal respiratory effort. No wheezes/rales/rhonchi noted. Neurologic:  Normal speech and language. No gross focal neurologic deficits are appreciated. Skin:  Skin is warm, dry and intact. No rash noted.  ____________________________________________   LABS  Labs Reviewed  SARS CORONAVIRUS 2 (TAT 6-24 HRS)    INITIAL IMPRESSION / ASSESSMENT AND PLAN / ED COURSE  Covid 19:  Tested positive on 4/25 His employer is requesting a repeat COVID test, advised him  this likely would still be positive Recommend Delsym OTC as needed for cough Work note provided to return to work tomorrow per [employer] guidelines ____________________________________________  FINAL CLINICAL IMPRESSION(S) / ED DIAGNOSES  Final diagnoses:  COVID-19        Lorre Munroe, NP 05/12/20 1345    Gilles Chiquito, MD 05/12/20 1544

## 2020-05-12 NOTE — ED Triage Notes (Signed)
Needs covid test

## 2020-05-12 NOTE — Discharge Instructions (Signed)
You were seen today for repeat COVID testing.  We will call you if you have a positive result.  According to the CDC guidelines you may return to work after 5 days as long as you are not running a fever.  I provided you with a work note.

## 2020-05-12 NOTE — ED Notes (Signed)
Pt here for repeat covid test 

## 2020-05-12 NOTE — ED Notes (Signed)
Here for COVID test. Experiencing congestion with runny nose

## 2020-05-13 LAB — SARS CORONAVIRUS 2 (TAT 6-24 HRS): SARS Coronavirus 2: NEGATIVE

## 2021-07-30 ENCOUNTER — Emergency Department (HOSPITAL_COMMUNITY): Admission: EM | Admit: 2021-07-30 | Discharge: 2021-07-30 | Discharge status: Against Medical Advice | Payer: Self-pay

## 2021-09-06 ENCOUNTER — Emergency Department (HOSPITAL_COMMUNITY)
Admission: EM | Admit: 2021-09-06 | Discharge: 2021-09-06 | Discharge status: Against Medical Advice | Payer: Self-pay | Attending: Emergency Medicine | Admitting: Emergency Medicine

## 2021-09-06 ENCOUNTER — Encounter (HOSPITAL_COMMUNITY): Payer: Self-pay | Admitting: Emergency Medicine

## 2021-09-06 ENCOUNTER — Other Ambulatory Visit: Payer: Self-pay

## 2021-09-06 DIAGNOSIS — M79605 Pain in left leg: Secondary | ICD-10-CM | POA: Insufficient documentation

## 2021-09-06 DIAGNOSIS — Z5329 Procedure and treatment not carried out because of patient's decision for other reasons: Secondary | ICD-10-CM | POA: Insufficient documentation

## 2021-09-06 NOTE — ED Triage Notes (Signed)
Pt to the ED with complaints of upper left leg pain with what he describes as a lump.

## 2021-09-06 NOTE — ED Provider Notes (Signed)
  [facility] EMERGENCY DEPARTMENT Provider Note   CSN: [id number] Arrival date & time: 09/06/21  1337     History  Chief Complaint  Patient presents with   Leg Pain    Christopher Meza is a 41 y.o. male knot to left upper leg, spontaneously arose 3-4 days ago, worse with walking, never had before, no hx of clots, no falls or trauma.  HPI     Home Medications Prior to Admission medications   Not on File      Allergies    Patient has no known allergies.    Review of Systems   Review of Systems  Physical Exam Updated Vital Signs BP 114/71   Pulse (!) 119   Temp 98 F (36.7 C) (Oral)   Resp 16   Ht 5\' 9"  (1.753 m)   Wt 77 kg   SpO2 97%   BMI 25.07 kg/m  Physical Exam Constitutional:      General: He is not in acute distress. HENT:     Head: Normocephalic and atraumatic.  Eyes:     Conjunctiva/sclera: Conjunctivae normal.     Pupils: Pupils are equal, round, and reactive to light.  Cardiovascular:     Rate and Rhythm: Normal rate and regular rhythm.  Pulmonary:     Effort: Pulmonary effort is normal. No respiratory distress.  Musculoskeletal:     Comments: Nodule with tenderness of left upper medial leg near inguinal fold, no overlying skin erythema  Skin:    General: Skin is warm and dry.  Neurological:     General: No focal deficit present.     Mental Status: He is alert. Mental status is at baseline.  Psychiatric:        Mood and Affect: Mood normal.        Behavior: Behavior normal.     ED Results / Procedures / Treatments   Labs (all labs ordered are listed, but only abnormal results are displayed) Labs Reviewed - No data to display  EKG None  Radiology No results found.  Procedures Procedures    Medications Ordered in ED Medications - No data to display  ED Course/ Medical Decision Making/ A&P                           Medical Decision Making  Ddx includes dvt vs lymphadenopathy vs hematoma vs other  No signs of infection - no  inciting source to suspect subcutaneous abscess  DVT ultrasound was not available at this time in the ED.  I recommended to the patient that we provide prophylactic Lovenox and that he can return tomorrow morning for a scheduled DVT study.  The patient however was upset and expressed his displeasure that I could not diagnose the cause of his leg bump. He refused the blood thinning medicine and then left the ER before an ultrasound could be scheduled, or discharge papers could be provided.         Final Clinical Impression(s) / ED Diagnoses Final diagnoses:  Left leg pain    Rx / DC Orders ED Discharge Orders     None         Abygail Galeno, , MD 09/06/21 1729

## 2021-09-07 ENCOUNTER — Other Ambulatory Visit: Payer: Self-pay

## 2021-09-07 ENCOUNTER — Other Ambulatory Visit (HOSPITAL_COMMUNITY): Payer: Self-pay | Admitting: Emergency Medicine

## 2021-09-07 DIAGNOSIS — R52 Pain, unspecified: Secondary | ICD-10-CM

## 2022-10-04 ENCOUNTER — Other Ambulatory Visit: Payer: Self-pay

## 2022-10-04 ENCOUNTER — Emergency Department (HOSPITAL_COMMUNITY): Payer: Self-pay

## 2022-10-04 ENCOUNTER — Emergency Department (HOSPITAL_COMMUNITY)
Admission: EM | Admit: 2022-10-04 | Discharge: 2022-10-04 | Disposition: A | Discharge status: Home / Self Care | Payer: Self-pay | Attending: Emergency Medicine | Admitting: Emergency Medicine

## 2022-10-04 ENCOUNTER — Encounter (HOSPITAL_COMMUNITY): Payer: Self-pay

## 2022-10-04 DIAGNOSIS — S0083XA Contusion of other part of head, initial encounter: Secondary | ICD-10-CM | POA: Insufficient documentation

## 2022-10-04 DIAGNOSIS — W19XXXA Unspecified fall, initial encounter: Secondary | ICD-10-CM | POA: Insufficient documentation

## 2022-10-04 DIAGNOSIS — R569 Unspecified convulsions: Secondary | ICD-10-CM | POA: Insufficient documentation

## 2022-10-04 DIAGNOSIS — Z23 Encounter for immunization: Secondary | ICD-10-CM | POA: Insufficient documentation

## 2022-10-04 LAB — CBC WITH DIFFERENTIAL/PLATELET
Abs Immature Granulocytes: 0.07 10*3/uL (ref 0.00–0.07)
Basophils Absolute: 0 10*3/uL (ref 0.0–0.1)
Basophils Relative: 0 %
Eosinophils Absolute: 0.1 10*3/uL (ref 0.0–0.5)
Eosinophils Relative: 1 %
HCT: 43.7 % (ref 39.0–52.0)
Hemoglobin: 14.7 g/dL (ref 13.0–17.0)
Immature Granulocytes: 1 %
Lymphocytes Relative: 19 %
Lymphs Abs: 1.9 10*3/uL (ref 0.7–4.0)
MCH: 33.9 pg (ref 26.0–34.0)
MCHC: 33.6 g/dL (ref 30.0–36.0)
MCV: 100.7 fL — ABNORMAL HIGH (ref 80.0–100.0)
Monocytes Absolute: 0.5 10*3/uL (ref 0.1–1.0)
Monocytes Relative: 5 %
Neutro Abs: 7.7 10*3/uL (ref 1.7–7.7)
Neutrophils Relative %: 74 %
Platelets: 293 10*3/uL (ref 150–400)
RBC: 4.34 MIL/uL (ref 4.22–5.81)
RDW: 11.7 % (ref 11.5–15.5)
WBC: 10.3 10*3/uL (ref 4.0–10.5)
nRBC: 0 % (ref 0.0–0.2)

## 2022-10-04 LAB — COMPREHENSIVE METABOLIC PANEL
ALT: 16 U/L (ref 0–44)
AST: 21 U/L (ref 15–41)
Albumin: 3.7 g/dL (ref 3.5–5.0)
Alkaline Phosphatase: 55 U/L (ref 38–126)
Anion gap: 12 (ref 5–15)
BUN: 11 mg/dL (ref 6–20)
CO2: 24 mmol/L (ref 22–32)
Calcium: 9.2 mg/dL (ref 8.9–10.3)
Chloride: 103 mmol/L (ref 98–111)
Creatinine, Ser: 0.97 mg/dL (ref 0.61–1.24)
GFR, Estimated: >60 mL/min (ref >60)
Glucose, Bld: 101 mg/dL — ABNORMAL HIGH (ref 70–99)
Potassium: 4.6 mmol/L (ref 3.5–5.1)
Sodium: 139 mmol/L (ref 135–145)
Total Bilirubin: 0.8 mg/dL (ref 0.3–1.2)
Total Protein: 6.8 g/dL (ref 6.5–8.1)

## 2022-10-04 LAB — RAPID URINE DRUG SCREEN, HOSP PERFORMED
Amphetamines: NOT DETECTED
Barbiturates: NOT DETECTED
Benzodiazepines: NOT DETECTED
Cocaine: POSITIVE — AB
Opiates: NOT DETECTED
Tetrahydrocannabinol: POSITIVE — AB

## 2022-10-04 MED ORDER — TETANUS-DIPHTH-ACELL PERTUSSIS 5-2.5-18.5 LF-MCG/0.5 IM SUSY
0.5000 mL | PREFILLED_SYRINGE | Freq: Once | INTRAMUSCULAR | Status: AC
  Administered 2022-10-04: 0.5 mL via INTRAMUSCULAR
  Filled 2022-10-04: qty 0.5

## 2022-10-04 MED ORDER — LACTATED RINGERS IV BOLUS
1000.0000 mL | Freq: Once | INTRAVENOUS | Status: AC
  Administered 2022-10-04: 1000 mL via INTRAVENOUS

## 2022-10-04 NOTE — ED Provider Notes (Signed)
[place] EMERGENCY DEPARTMENT AT [facility] Provider Note   CSN: [id number] Arrival date & time: 10/04/22  1053     History  Chief Complaint  Patient presents with   Seizures   Fall    Christopher Meza is a 42 y.o. male.   Seizures Fall  42 year old male previously healthy presenting for concern for seizure and fall.  Patient was at home today.  Not yet gotten out of bed.  Wife heard a thud.  About 10 minutes later she went to check on him and found him on the ground.  He was diaphoretic, breathing heavily, completely unresponsive.  He was stiff when she found him.  She did not witness any shaking or seizure-like activity.  A few minutes later he started to wake up and was confused for several minutes before he returned to baseline.  He has no history of seizures.  Does not drink alcohol regularly and has no history of withdrawal.  He does smoke marijuana but denies any other substance use.  He last smoked last night, he did not use anything different than normal his wife also smokes the same marijuana and did not have any issues.  He takes no medications.  He has some bruising on the left cheek which is painful.  No headache now or recently.  No chest pain, shortness of breath, dizziness, palpitations.  No fevers or chills.  He is otherwise been at his baseline health.     Home Medications Prior to Admission medications   Not on File      Allergies    Patient has no known allergies.    Review of Systems   Review of Systems  Neurological:  Positive for seizures.  Review of systems completed and notable as per HPI.  ROS otherwise negative.   Physical Exam Updated Vital Signs BP (!) 139/94 (BP Location: Right Arm)   Pulse 87   Temp 98.2 F (36.8 C) (Oral)   Resp 16   Ht 5\' 9"  (1.753 m)   Wt 77 kg   SpO2 98%   BMI 25.07 kg/m  Physical Exam Vitals and nursing note reviewed.  Constitutional:      General: He is not in acute distress.    Appearance: He is  well-developed.  HENT:     Head: Normocephalic.     Comments: Mild bruising and abrasion to the left cheek.  No ocular trauma or swelling.    Nose: Nose normal.     Mouth/Throat:     Mouth: Mucous membranes are moist.     Pharynx: Oropharynx is clear.  Eyes:     Extraocular Movements: Extraocular movements intact.     Conjunctiva/sclera: Conjunctivae normal.     Pupils: Pupils are equal, round, and reactive to light.  Cardiovascular:     Rate and Rhythm: Normal rate and regular rhythm.     Heart sounds: No murmur heard. Pulmonary:     Effort: Pulmonary effort is normal. No respiratory distress.     Breath sounds: Normal breath sounds.  Abdominal:     Palpations: Abdomen is soft.     Tenderness: There is no abdominal tenderness. There is no guarding or rebound.  Musculoskeletal:        General: No swelling.     Cervical back: Normal range of motion and neck supple. No rigidity or tenderness.     Right lower leg: No edema.     Left lower leg: No edema.  Skin:  General: Skin is warm and dry.     Capillary Refill: Capillary refill takes less than 2 seconds.  Neurological:     General: No focal deficit present.     Mental Status: He is alert and oriented to person, place, and time. Mental status is at baseline.     Cranial Nerves: No cranial nerve deficit.     Sensory: No sensory deficit.     Motor: No weakness.     Coordination: Coordination normal.     Deep Tendon Reflexes: Reflexes normal.  Psychiatric:        Mood and Affect: Mood normal.     ED Results / Procedures / Treatments   Labs (all labs ordered are listed, but only abnormal results are displayed) Labs Reviewed  COMPREHENSIVE METABOLIC PANEL - Abnormal; Notable for the following components:      Result Value   Glucose, Bld 101 (*)    All other components within normal limits  CBC WITH DIFFERENTIAL/PLATELET - Abnormal; Notable for the following components:   MCV 100.7 (*)    All other components within  normal limits  RAPID URINE DRUG SCREEN, HOSP PERFORMED - Abnormal; Notable for the following components:   Cocaine POSITIVE (*)    Tetrahydrocannabinol POSITIVE (*)    All other components within normal limits    EKG EKG Interpretation Date/Time:  Sunday October 04 2022 11:17:03 EDT Ventricular Rate:  84 PR Interval:  140 QRS Duration:  86 QT Interval:  337 QTC Calculation: 399 R Axis:   63  Text Interpretation: Sinus rhythm Confirmed by Fulton Reek [phone]) on 10/04/2022 11:25:01 AM  Radiology CT Head Wo Contrast  Result Date: 10/04/2022 CLINICAL DATA:  Head trauma, moderate to severe. EXAM: CT HEAD WITHOUT CONTRAST TECHNIQUE: Contiguous axial images were obtained from the base of the skull through the vertex without intravenous contrast. RADIATION DOSE REDUCTION: This exam was performed according to the departmental dose-optimization program which includes automated exposure control, adjustment of the mA and/or kV according to patient size and/or use of iterative reconstruction technique. COMPARISON:  None Available. FINDINGS: Brain: No evidence of acute infarction, hemorrhage, hydrocephalus, extra-axial collection or mass lesion/mass effect. Vascular: No hyperdense vessel or unexpected calcification. Skull: Normal. Negative for fracture or focal lesion. Sinuses/Orbits: Generalized mucosal thickening in the paranasal sinuses without fluid level. IMPRESSION: 1. No evidence of intracranial injury. 2. Generalized sinus inflammation. Electronically Signed   By: Tiburcio Pea M.D.   On: 10/04/2022 12:50    Procedures Procedures    Medications Ordered in ED Medications  lactated ringers bolus 1,000 mL (1,000 mLs Intravenous New Bag/Given 10/04/22 1208)  Tdap (BOOSTRIX) injection 0.5 mL (0.5 mLs Intramuscular Given 10/04/22 1205)    ED Course/ Medical Decision Making/ A&P                                 Medical Decision Making Amount and/or Complexity of Data Reviewed Labs:  ordered. Radiology: ordered.  Risk Prescription drug management.   Medical Decision Making:   Christopher Meza is a 42 y.o. male who presented to the ED today with concern for seizure and fall.  Vital signs reviewed.  On exam here he is okay.  He is back to baseline, feels somewhat tired.  Wife describes episode of complete unresponsiveness where he was breathing heavily.  No observed convulsive activity although he was not checked on immediately, episode is concerning for seizure.  Seems quite prolonged for  syncope.  I reviewed his EKG, I do not see any signs of arrhythmia or ischemia.  Consider possible metabolic abnormality, toxic effect from marijuana or contaminated substance.  Will obtain CT head, lab workup and observe.  He has no fever or meningismus, low concern for CNS infection such as meningitis or encephalitis.   Patient placed on continuous vitals and telemetry monitoring while in ED which was reviewed periodically.  Reviewed and confirmed nursing documentation for past medical history, family history, social history.  Reassessment and Plan:   Lab work is unremarkable.  CT head without evidence of intracranial bleeding or other acute abnormality.  On reassessment he remained stable.  He is ambulating and eating without any difficulty.  He is a been observed in the ED for about 4 hours without any recurrence of symptoms.  His urinalysis is positive for cocaine.  I spoke with him alone about this.  He reports no cocaine use only THC.  I suspect he could have had marijuana laced with cocaine.  This could potentially precipitate seizure.  I discussed with him that he needs to follow-up with neurology, and should not drive or do any other dangerous activity given risk for potential other seizures.  I do not think there is an indication to start seizure medication at this point given only single episode and potentially precipitated by cocaine.  Recommend he abstain from any drug use and follow-up  closely with a primary care provider and neurology.  He and his family were comfortable this plan.  Given strict return precautions for fever, severe headache, weakness, numbness or recurrent seizures.   Patient's presentation is most consistent with acute complicated illness / injury requiring diagnostic workup.           Final Clinical Impression(s) / ED Diagnoses Final diagnoses:  Seizure [facility])    Rx / DC Orders ED Discharge Orders     None         Laurence Spates, MD 10/04/22 [phone]

## 2022-10-04 NOTE — Discharge Instructions (Signed)
The episode he presented for today is concerning for seizure.  The CT scan and lab work are reassuring.  I recommend you follow-up closely with neurology and a primary care provider.  Neurology should call you to schedule follow-up.  Until you see neurology, you should not drive or do any other dangerous activity because if you have another seizure this could be very dangerous.  If you have additional episodes or seizures, severe headache, fever, weakness or numbness you should return to the ED.

## 2022-10-04 NOTE — ED Triage Notes (Signed)
PT BIB by [facility] from home after wife heard a thud, walked in the room and found him on the floor.PT has no hx of seizures but when EMS arrived,PT was found to be in a postical state, diaphoretic, clammy and confused.There is a bruise on the left side of his face. No LOC reported. pPT does state that he smokes marijuana

## 2022-10-04 NOTE — ED Notes (Signed)
Pt is in a gown

## 2022-11-09 ENCOUNTER — Ambulatory Visit: Payer: Self-pay | Admitting: Podiatry

## 2022-11-23 ENCOUNTER — Encounter: Payer: Self-pay | Admitting: Neurology

## 2022-11-23 ENCOUNTER — Ambulatory Visit (INDEPENDENT_AMBULATORY_CARE_PROVIDER_SITE_OTHER): Payer: No Typology Code available for payment source | Admitting: Neurology

## 2022-11-23 VITALS — BP 130/81 | HR 86 | Ht 70.0 in | Wt 163.5 lb

## 2022-11-23 DIAGNOSIS — Z87828 Personal history of other (healed) physical injury and trauma: Secondary | ICD-10-CM

## 2022-11-23 DIAGNOSIS — G40909 Epilepsy, unspecified, not intractable, without status epilepticus: Secondary | ICD-10-CM | POA: Diagnosis not present

## 2022-11-23 MED ORDER — DIVALPROEX SODIUM ER 500 MG PO TB24: 500.0000 mg | ORAL_TABLET | Freq: Every evening | ORAL | 3 refills | Status: DC
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
## 2022-11-26 ENCOUNTER — Telehealth: Payer: Self-pay | Admitting: Neurology

## 2022-11-26 NOTE — Telephone Encounter (Signed)
[facility]: [id number] exp. 11/26/22-12/26/22 sent to GI [phone]

## 2022-12-01 DIAGNOSIS — Z87828 Personal history of other (healed) physical injury and trauma: Secondary | ICD-10-CM | POA: Insufficient documentation

## 2022-12-25 ENCOUNTER — Ambulatory Visit: Payer: Self-pay | Admitting: Neurology

## 2022-12-28 ENCOUNTER — Ambulatory Visit (INDEPENDENT_AMBULATORY_CARE_PROVIDER_SITE_OTHER): Payer: No Typology Code available for payment source | Admitting: Neurology

## 2022-12-28 DIAGNOSIS — G40909 Epilepsy, unspecified, not intractable, without status epilepticus: Secondary | ICD-10-CM | POA: Diagnosis not present

## 2023-01-06 ENCOUNTER — Encounter: Payer: Self-pay | Admitting: Adult Health

## 2023-01-11 ENCOUNTER — Encounter: Payer: Self-pay | Admitting: Neurology

## 2023-01-15 ENCOUNTER — Ambulatory Visit
Admission: RE | Admit: 2023-01-15 | Discharge: 2023-01-15 | Disposition: A | Discharge status: Home / Self Care | Payer: No Typology Code available for payment source | Source: Ambulatory Visit | Attending: Neurology | Admitting: Neurology

## 2023-01-15 DIAGNOSIS — G40909 Epilepsy, unspecified, not intractable, without status epilepticus: Secondary | ICD-10-CM | POA: Diagnosis not present

## 2023-01-15 MED ORDER — GADOPICLENOL 0.5 MMOL/ML IV SOLN
7.0000 mL | Freq: Once | INTRAVENOUS | Status: AC | PRN
  Administered 2023-01-15: 7 mL via INTRAVENOUS

## 2023-01-18 ENCOUNTER — Telehealth: Payer: Self-pay | Admitting: Neurology

## 2023-01-18 NOTE — Telephone Encounter (Signed)
 I spoke with the patient and provided the results of the MRI. He would like results to be sent to his PCP.   He also inquired about the EEG. I relayed the message from Dr. Onita stating that the EEG was normal.  He verbalized understanding of the findings and expressed appreciation for the call.

## 2023-01-18 NOTE — Telephone Encounter (Signed)
 Please call patient, MRI of the brain showed mild small vessel disease, there is also evidence of chronic sinusitis, especially on the right maxillary region,  If he has any sinus symptoms, such as stuffy nose, nasal discharge, he should should seek help from his primary care, even consider ENT referral by his primary care   IMPRESSION: MRI scan of the brain with and without contrast showing nonspecific periventricular subcortical T2/FLAIR white matter hyperintensities with the differential discussed above.  Insulin changes of chronic paranasal sinusitis also noted

## 2023-05-27 ENCOUNTER — Telehealth: Payer: Self-pay | Admitting: Adult Health

## 2023-05-27 NOTE — Telephone Encounter (Signed)
 MYC conf
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
## 2023-05-31 ENCOUNTER — Ambulatory Visit: Payer: No Typology Code available for payment source | Admitting: Adult Health

## 2023-05-31 ENCOUNTER — Encounter: Payer: Self-pay | Admitting: Adult Health

## 2023-06-09 ENCOUNTER — Telehealth: Payer: Self-pay | Admitting: Neurology

## 2023-06-09 NOTE — Telephone Encounter (Signed)
 Pt has called to r/s missed appointment
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
## 2023-06-21 ENCOUNTER — Telehealth: Payer: Self-pay | Admitting: Neurology

## 2023-06-21 ENCOUNTER — Ambulatory Visit: Admitting: Adult Health

## 2023-06-21 NOTE — Telephone Encounter (Signed)
 Request to r/s appointment

## 2023-07-12 NOTE — Telephone Encounter (Signed)
 Pt has responded to earlier appointment notification, he has accepted an earlier appointment
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
## 2023-07-26 ENCOUNTER — Encounter: Payer: Self-pay | Admitting: Adult Health

## 2023-07-26 ENCOUNTER — Ambulatory Visit (INDEPENDENT_AMBULATORY_CARE_PROVIDER_SITE_OTHER): Admitting: Adult Health

## 2023-07-26 VITALS — BP 135/86 | HR 80 | Ht 69.0 in | Wt 165.0 lb

## 2023-07-26 DIAGNOSIS — R569 Unspecified convulsions: Secondary | ICD-10-CM | POA: Diagnosis not present

## 2023-07-26 DIAGNOSIS — R29818 Other symptoms and signs involving the nervous system: Secondary | ICD-10-CM

## 2023-07-26 MED ORDER — NAYZILAM 5 MG/0.1ML NA SOLN: 5.0000 mg | Freq: Every day | NASAL | 0 refills | Status: AC | PRN

## 2023-07-26 MED ORDER — DIVALPROEX SODIUM ER 500 MG PO TB24: 500.0000 mg | ORAL_TABLET | Freq: Every evening | ORAL | 3 refills | Status: AC

## 2023-07-26 NOTE — Patient Instructions (Addendum)
 Your Plan:  Restart Depakote  ER 500mg  nightly - please ensure you are taking nightly without missing a dose to avoid seizure occurrence   Order will be placed for Nayzilam  which is a seizure rescue nasal spray. You will be a dose in 1 nostril at seizure onset, can repeat dose in other nostril after 10 minutes if needed.   Referral will be placed to our sleep clinic to evaluate for possible underlying sleep apnea  Please call with any recurrent seizure activity       Follow up with Dr. Onita in 4-6 months or call earlier if needed     Thank you for coming to see us  at [facility]. I hope we have been able to provide you high quality care today.  You may receive a patient satisfaction survey over the next few weeks. We would appreciate your feedback and comments so that we may continue to improve ourselves and the health of our patients.

## 2023-08-11 ENCOUNTER — Ambulatory Visit: Admitting: Adult Health

## 2023-08-16 ENCOUNTER — Encounter: Payer: Self-pay | Admitting: Neurology

## 2023-08-16 ENCOUNTER — Ambulatory Visit: Admitting: Neurology

## 2023-08-16 VITALS — BP 129/78 | HR 103 | Ht 70.0 in | Wt 170.0 lb

## 2023-08-16 DIAGNOSIS — R0683 Snoring: Secondary | ICD-10-CM

## 2023-08-16 DIAGNOSIS — G4719 Other hypersomnia: Secondary | ICD-10-CM

## 2023-08-16 DIAGNOSIS — Z9189 Other specified personal risk factors, not elsewhere classified: Secondary | ICD-10-CM | POA: Diagnosis not present

## 2023-08-16 DIAGNOSIS — F1990 Other psychoactive substance use, unspecified, uncomplicated: Secondary | ICD-10-CM

## 2023-08-16 DIAGNOSIS — G40909 Epilepsy, unspecified, not intractable, without status epilepticus: Secondary | ICD-10-CM

## 2023-08-16 DIAGNOSIS — R29818 Other symptoms and signs involving the nervous system: Secondary | ICD-10-CM

## 2023-08-16 DIAGNOSIS — R569 Unspecified convulsions: Secondary | ICD-10-CM

## 2023-08-16 DIAGNOSIS — R0681 Apnea, not elsewhere classified: Secondary | ICD-10-CM

## 2023-08-16 NOTE — Progress Notes (Signed)
 Subjective:    Patient ID: Christopher Meza is a 43 y.o. male.  HPI    True Mar, MD, PhD [facility] [address] [address] [place], [place] [address]  Dear Harlene and Modena,   I saw your patient, Christopher Meza, upon your kind request in my sleep clinic today for initial consultation of his sleep disorder, in particular, concern for underlying obstructive sleep apnea.  The patient is unaccompanied today (he reports that his brother dropped him off and will pick him up). As you know, Christopher Meza is a 43 year old male with an underlying medical history of seizure disorder, hyperlipidemia, substance use (including smoking THC and using cocaine per self-report), and cigarette smoking, who reports snoring and excessive daytime somnolence as well as witnessed apneas per wife's report.  His Epworth sleepiness score is 13 out of 24, fatigue severity score is 44 out of 63.  I reviewed your office note from 07/26/2023, last seizure at the time was reportedly in May 2025. He was reminded during this appointment that he cannot drive.  He reports working off and on that that his cousin takes him to his work place. He was restarted on Depakote .  He goes to bed late, typically between midnight and 1 AM.  Rise time is between 7 and 7:30 AM.  He has no nightly nocturia and denies recurrent nocturnal or morning headaches.  He drinks alcohol occasionally.  He smokes marijuana and also has used cocaine, last use reported about a week ago.  He lives with his wife and 61 year old son and 78 year old daughter.  They have a set of 4 kittens currently in the house, the kittens do not sleep in his bedroom at night.  He drinks caffeine in the form of green tea, up to 3 cans/day.  He smokes about 10 cigarettes/day.  His Past Medical History Is Significant For: Past Medical History:  Diagnosis Date   Hyperlipidemia     His Past Surgical History Is Significant For: History reviewed. No  pertinent surgical history.  His Family History Is Significant For: Family History  Problem Relation Age of Onset   Diabetes Mother    Diabetes Maternal Grandmother    Sleep apnea Neg Hx     His Social History Is Significant For: Social History   Socioeconomic History   Marital status: Married    Spouse name: denise   Number of children: 5   Years of education: Not on file   Highest education level: GED or equivalent  Occupational History   Not on file  Tobacco Use   Smoking status: Every Day    Current packs/day: 0.50    Average packs/day: 0.5 packs/day for 20.0 years (10.0 ttl pk-yrs)    Types: Cigarettes   Smokeless tobacco: Never  Vaping Use   Vaping status: Never Used  Substance and Sexual Activity   Alcohol use: Yes    Alcohol/week: 3.0 standard drinks of alcohol    Types: 3 Standard drinks or equivalent per week    Comment: social   Drug use: Yes    Frequency: 5.0 times per week    Types: Marijuana, Cocaine   Sexual activity: Yes    Birth control/protection: None  Other Topics Concern   Not on file  Social History Narrative   Pt lives family    Pt works   Social Drivers of Corporate investment banker Strain: Not on file  Food Insecurity: Not on file  Transportation Needs: Not on file  Physical Activity: Not on file  Stress: Not on file  Social Connections: Not on file    His Allergies Are:  No Known Allergies:   His Current Medications Are:  Outpatient Encounter Medications as of 08/16/2023  Medication Sig   divalproex  (DEPAKOTE  ER) 500 MG 24 hr tablet Take 1 tablet (500 mg total) by mouth at bedtime.   Midazolam  (NAYZILAM ) 5 MG/0.1ML SOLN Place 5 mg into the nose daily as needed (seizure). Can repeat in other nostril after 10 minutes if needed (Patient taking differently: Place 5 mg into the nose as needed (seizure). Can repeat in other nostril after 10 minutes if needed)   No facility-administered encounter medications on file as of 08/16/2023.   :   Review of Systems:  Out of a complete 14 point review of systems, all are reviewed and negative with the exception of these symptoms as listed below:   Review of Systems  Neurological:        Pt here for sleep consult Pt snores,fatigue,headaches Pt denies hypertension,sleep study,cpap machine    ESS:13 FSS:44        Objective:  Neurological Exam  Physical Exam Physical Examination:   Vitals:   08/16/23 1514  BP: 129/78  Pulse: (!) 103    General Examination: The patient is a very pleasant 43 y.o. male in no acute distress. He appears well-developed and well-nourished and well groomed.   HEENT: Normocephalic, atraumatic, pupils are equal, round and reactive to light, extraocular tracking is good without limitation to gaze excursion or nystagmus noted. Hearing is grossly intact. Face is symmetric with normal facial animation. Speech is clear with no dysarthria noted. There is no hypophonia. There is no lip, neck/head, jaw or voice tremor. Neck is supple with full range of passive and active motion. There are no carotid bruits on auscultation. Oropharynx exam reveals: mild mouth dryness, adequate dental hygiene and moderate airway crowding, due to small airway entry and prominent uvula, tonsillar size of about 1+ to 2+ bilaterally.  Tongue protrudes centrally and palate elevates symmetrically, neck circumference 14-7/8 inches, mild to moderate overbite noted.  Chest: Clear to auscultation without wheezing, rhonchi or crackles noted.  Heart: S1+S2+0, regular and normal without murmurs, rubs or gallops noted.   Abdomen: Soft, non-tender and non-distended.  Extremities: There is no pitting edema in the distal lower extremities bilaterally.   Skin: Warm and dry without trophic changes noted.   Musculoskeletal: exam reveals no obvious joint deformities.   Neurologically:  Mental status: The patient is awake, alert and oriented in all 4 spheres. His immediate and remote  memory, attention, language skills and fund of knowledge are appropriate. There is no evidence of aphasia, agnosia, apraxia or anomia. Speech is clear with normal prosody and enunciation. Thought process is linear. Mood is normal and affect is normal.  Cranial nerves II - XII are as described above under HEENT exam.  Motor exam: Normal bulk, strength and tone is noted. There is no obvious action or resting tremor.  Fine motor skills and coordination: grossly intact.  Cerebellar testing: No dysmetria or intention tremor. There is no truncal or gait ataxia.  Sensory exam: intact to light touch in the upper and lower extremities.  Gait, station and balance: He stands easily. No veering to one side is noted. No leaning to one side is noted. Posture is age-appropriate and stance is narrow based. Gait shows normal stride length and normal pace. No problems turning are noted.   Assessment and Plan:  In summary, Christopher Meza is a very pleasant 43 y.o.-year old male with an underlying medical history of seizure disorder, hyperlipidemia, substance use (including smoking THC and using cocaine per self-report), and cigarette smoking, whose history and physical exam are concerning for sleep disordered breathing, particularly obstructive sleep apnea (OSA). A laboratory attended sleep study is typically considered gold standard for evaluation of sleep disordered breathing.   The patient is strongly advised not to drive, he reportedly had a seizure in May 2025 and is reminded that he should not be driving.  The patient is counseled regarding smoking cessation, THC and cocaine cessation as well today.  I had a long chat with the patient about my findings and the diagnosis of sleep apnea, particularly OSA, its prognosis and treatment options. We talked about medical/conservative treatments, surgical interventions and non-pharmacological approaches for symptom control. I explained, in particular, the risks and  ramifications of untreated moderate to severe OSA, especially with respect to developing cardiovascular disease down the road, including congestive heart failure (CHF), difficult to treat hypertension, cardiac arrhythmias (particularly A-fib), neurovascular complications including TIA, stroke and dementia. Even type 2 diabetes has, in part, been linked to untreated OSA. Symptoms of untreated OSA may include (but may not be limited to) daytime sleepiness, nocturia (i.e. frequent nighttime urination), memory problems, mood irritability and suboptimally controlled or worsening mood disorder such as depression and/or anxiety, lack of energy, lack of motivation, physical discomfort, as well as recurrent headaches, especially morning or nocturnal headaches. We talked about the importance of maintaining a healthy lifestyle and striving for healthy weight. In addition, we talked about the importance of striving for and maintaining good sleep hygiene. I recommended a sleep study at this time. I outlined the differences between a laboratory attended sleep study which is considered more comprehensive and accurate over the option of a home sleep test (HST); the latter may lead to underestimation of sleep disordered breathing in some instances and does not help with diagnosing upper airway resistance syndrome and is not accurate enough to diagnose primary central sleep apnea typically. I outlined possible surgical and non-surgical treatment options of OSA, including the use of a positive airway pressure (PAP) device (i.e. CPAP, AutoPAP/APAP or BiPAP in certain circumstances), a custom-made dental device (aka oral appliance, which would require a referral to a specialist dentist or orthodontist typically, and is generally speaking not considered for patients with full dentures or edentulous state), upper airway surgical options, such as traditional UPPP (which is not considered a first-line treatment) or the Inspire device  (hypoglossal nerve stimulator, which would involve a referral for consultation with an ENT surgeon, after careful selection, following inclusion criteria - also not first-line treatment). I explained the PAP treatment option to the patient in detail, as this is generally considered first-line treatment.  The patient indicated that he would be willing to try PAP therapy, if the need arises. I explained the importance of being compliant with PAP treatment, not only for insurance purposes but primarily to improve patient's symptoms symptoms, and for the patient's long term health benefit, including to reduce His cardiovascular risks longer-term.    We will pick up our discussion about the next steps and treatment options after testing.  We will keep him posted as to the test results by phone call and/or MyChart messaging where possible.  We will plan to follow-up in sleep clinic accordingly as well.  I answered all his questions today and the patient was in agreement.   I encouraged him  to call with any interim questions, concerns, problems or updates or email us  through MyChart.  Generally speaking, sleep test authorizations may take up to 2 weeks, sometimes less, sometimes longer, the patient is encouraged to get in touch with us  if they do not hear back from the sleep lab staff directly within the next 2 weeks.  Thank you very much for allowing me to participate in the care of this nice patient. If I can be of any further assistance to you please do not hesitate to talk to me.   Sincerely,   True Mar, MD, PhD

## 2023-08-16 NOTE — Patient Instructions (Addendum)
 Thank you for choosing [facility] for your sleep related care! It was nice to meet you today!   Here is what we discussed today:   Please stop using all illicit drugs.   Please stop smoking cigarettes.   Please remember: You cannot drive unless you have been free of seizures for 6 months straight.    Based on your symptoms and your exam I believe you are at risk for obstructive sleep apnea (aka OSA). We should proceed with a sleep study to determine whether you do or do not have OSA and how severe it is. Even, if you have mild OSA, I may want you to consider treatment with CPAP, as treatment of even borderline or mild sleep apnea can result and improvement of symptoms such as sleep disruption, daytime sleepiness, nighttime bathroom breaks, restless leg symptoms, improvement of headache syndromes, even improved mood disorder.   As explained, an attended sleep study (meaning you get to stay overnight in the sleep lab), lets us  monitor sleep-related behaviors such as sleep talking and leg movements in sleep, in addition to monitoring for sleep apnea.  A home sleep test is a screening tool for sleep apnea diagnosis only, but unfortunately, does not help with any other sleep-related diagnoses.  Please remember, the long-term risks and ramifications of untreated moderate to severe obstructive sleep apnea may include (but are not limited to): increased risk for cardiovascular disease, including congestive heart failure, stroke, difficult to control hypertension, treatment resistant obesity, arrhythmias, especially irregular heartbeat commonly known as A. Fib. (atrial fibrillation); even type 2 diabetes has been linked to untreated OSA.   Other correlations that untreated obstructive sleep apnea include macular edema which is swelling of the retina in the eyes, droopy eyelid syndrome, and elevated hemoglobin and hematocrit levels (often referred to as polycythemia).  Sleep apnea can  cause disruption of sleep and sleep deprivation in most cases, which, in turn, can cause recurrent headaches, problems with memory, mood, concentration, focus, and vigilance. Most people with untreated sleep apnea report excessive daytime sleepiness, which can affect their ability to drive. Please do not drive or use heavy equipment or machinery, if you feel sleepy! Patients with sleep apnea can also develop difficulty initiating and maintaining sleep (aka insomnia).   Having sleep apnea may increase your risk for other sleep disorders, including involuntary behaviors sleep such as sleep terrors, sleep talking, sleepwalking.    Having sleep apnea can also increase your risk for restless leg syndrome and leg movements at night.   Please note that untreated obstructive sleep apnea may carry additional perioperative morbidity. Patients with significant obstructive sleep apnea (typically, in the moderate to severe degree) should receive, if possible, perioperative PAP (positive airway pressure) therapy and the surgeons and particularly the anesthesiologists should be informed of the diagnosis and the severity of the sleep disordered breathing.   We will call you or email you through MyChart with regards to your test results and plan a follow-up in sleep clinic accordingly. Most likely, you will hear from one of our nurses.   Our sleep lab administrative assistant will call you to schedule your sleep study and give you further instructions, regarding the check in process for the sleep study, arrival time, what to bring, when you can expect to leave after the study, etc., and to answer any other logistical questions you may have. If you don't hear back from her by about 2 weeks from now, please feel free to call her direct line at  [phone] or you can call our general clinic number, or email us  through My Chart.

## 2023-08-18 ENCOUNTER — Telehealth: Payer: Self-pay | Admitting: Neurology

## 2023-08-18 NOTE — Telephone Encounter (Signed)
 [facility] [employer] pending

## 2023-08-19 NOTE — Telephone Encounter (Signed)
 [facility] [employer] no auth req via fax EE

## 2023-08-19 NOTE — Telephone Encounter (Signed)
 [facility] [employer] no auth req via fax   Patient is scheduled at [facility] for 09/12/2023 at 9 pm.   Mailed packet and sent mychart

## 2023-08-23 ENCOUNTER — Ambulatory Visit: Admitting: Adult Health

## 2023-09-12 ENCOUNTER — Encounter

## 2023-10-19 ENCOUNTER — Encounter

## 2023-12-30 ENCOUNTER — Ambulatory Visit: Admitting: Neurology

## 2023-12-30 ENCOUNTER — Encounter: Payer: Self-pay | Admitting: Neurology

## 2023-12-30 ENCOUNTER — Telehealth: Payer: Self-pay | Admitting: Neurology

## 2023-12-30 NOTE — Telephone Encounter (Signed)
 R/s missed appointment

## 2024-05-01 ENCOUNTER — Ambulatory Visit: Admitting: Neurology
